# Patient Record
Sex: Female | Born: 2000 | Race: White | Hispanic: No | Marital: Single | Smoking: Never smoker
Health system: Southern US, Community
[De-identification: ages and names within clinical notes are randomized; demographics above are authoritative.]

## PROBLEM LIST (undated history)

## (undated) DIAGNOSIS — F419 Anxiety disorder, unspecified: Secondary | ICD-10-CM

## (undated) DIAGNOSIS — F32A Depression, unspecified: Secondary | ICD-10-CM

## (undated) DIAGNOSIS — J3089 Other allergic rhinitis: Secondary | ICD-10-CM

## (undated) DIAGNOSIS — F329 Major depressive disorder, single episode, unspecified: Secondary | ICD-10-CM

## (undated) DIAGNOSIS — T7840XA Allergy, unspecified, initial encounter: Secondary | ICD-10-CM

## (undated) HISTORY — PX: INDUCED ABORTION: SHX677

## (undated) HISTORY — PX: WISDOM TOOTH EXTRACTION: SHX21

## (undated) HISTORY — DX: Anxiety disorder, unspecified: F41.9

## (undated) HISTORY — DX: Allergy, unspecified, initial encounter: T78.40XA

## (undated) HISTORY — PX: TONSILLECTOMY AND ADENOIDECTOMY: SHX28

---
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
## 2020-01-02 ENCOUNTER — Encounter: Payer: Self-pay | Admitting: Physician Assistant

## 2020-01-02 ENCOUNTER — Telehealth (INDEPENDENT_AMBULATORY_CARE_PROVIDER_SITE_OTHER): Payer: Medicaid Other | Admitting: Physician Assistant

## 2020-01-02 DIAGNOSIS — B379 Candidiasis, unspecified: Secondary | ICD-10-CM

## 2020-01-02 DIAGNOSIS — T3695XA Adverse effect of unspecified systemic antibiotic, initial encounter: Secondary | ICD-10-CM

## 2020-01-02 DIAGNOSIS — Z01419 Encounter for gynecological examination (general) (routine) without abnormal findings: Secondary | ICD-10-CM

## 2020-01-02 DIAGNOSIS — J0140 Acute pansinusitis, unspecified: Secondary | ICD-10-CM | POA: Diagnosis not present

## 2020-01-02 MED ORDER — AMOXICILLIN-POT CLAVULANATE 875-125 MG PO TABS: 1.0000 | ORAL_TABLET | Freq: Two times a day (BID) | ORAL | 0 refills | Status: DC

## 2020-01-02 MED ORDER — FLUCONAZOLE 150 MG PO TABS: 150.0000 mg | ORAL_TABLET | Freq: Once | ORAL | 0 refills | Status: AC

## 2020-01-02 NOTE — Progress Notes (Signed)
Virtual telephone visit    Virtual Visit via Telephone Note   This visit type was conducted due to national recommendations for restrictions regarding the COVID-19 Pandemic (e.g. social distancing) in an effort to limit this patient's exposure and mitigate transmission in our community. Due to her co-morbid illnesses, this patient is at least at moderate risk for complications without adequate follow up. This format is felt to be most appropriate for this patient at this time. The patient did not have access to video technology or had technical difficulties with video requiring transitioning to audio format only (telephone). Physical exam was limited to content and character of the telephone converstion.    Patient location: Home Provider location: [facility]  I discussed the limitations of evaluation and management by telemedicine and the availability of in person appointments. The patient expressed understanding and agreed to proceed.   Visit Date: 01/02/2020  Today's healthcare provider: Margaretann Loveless, PA-C   Chief Complaint  Patient presents with  . Sinusitis   Subjective    Sinusitis This is a new problem. The current episode started 1 to 4 weeks ago. There has been no fever. Associated symptoms include ear pain (Last night), headaches, sinus pressure and swollen glands. Pertinent negatives include no chills, shortness of breath or sore throat. Treatments tried: took IBU last night becuase of muscle ache but much better and Allegra. The treatment provided mild relief.    Symptoms had started on 12/16/19 and she was covid tested and that was negative. Reports symptoms had been getting better then started getting worse again 3 days ago.   Patient Active Problem List   Diagnosis Date Noted  . Sleep difficulties 07/17/2015  . Inattention 06/17/2015  . 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 Headache disorder 06/11/2015  . Nocturnal enuresis 06/11/2015  . 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 Recurrent  sinus infections 06/11/2015  . Allergic rhinitis, seasonal 06/11/2015  . Migraine without aura and without status migrainosus, not intractable 01/05/2015  . Episodic tension-type headache, not intractable 01/05/2015   Past Medical History:  Diagnosis Date  . Depression   . Environmental and seasonal allergies       Medications: Outpatient Medications Prior to Visit  Medication Sig  . doxycycline (VIBRAMYCIN) 100 MG capsule Take 1 capsule (100 mg total) by mouth 2 (two) times daily. (Patient not taking: Reported on 12/08/2019)  . fluconazole (DIFLUCAN) 150 MG tablet 1 tablet if needed for yeast infection (Patient not taking: Reported on 12/08/2019)  . HYDROcodone-acetaminophen (NORCO/VICODIN) 5-325 MG tablet Take 1 tablet by mouth every 6 (six) hours as needed for moderate pain. (Patient not taking: Reported on 12/08/2019)  . naproxen (NAPROSYN) 500 MG tablet Take 1 tablet (500 mg total) by mouth 2 (two) times daily with a meal. (Patient not taking: Reported on 12/08/2019)  . norethindrone-ethinyl estradiol (LOESTRIN 1/20, 21,) 1-20 MG-MCG tablet Take 1 tablet by mouth daily. (Patient not taking: Reported on 12/08/2019)   No facility-administered medications prior to visit.    Review of Systems  Constitutional: Negative for appetite change, chills, fatigue and fever.  HENT: Positive for ear pain (Last night), postnasal drip, rhinorrhea, sinus pressure and sinus pain. Negative for sore throat.   Respiratory: Negative for chest tightness, shortness of breath and wheezing.   Cardiovascular: Negative for chest pain.  Gastrointestinal: Negative for abdominal pain.  Neurological: Positive for headaches. Negative for dizziness.    Last CBC Lab Results  Component Value Date   WBC 11.1 (H) 11/19/2019   HGB 12.2 11/19/2019  HCT 36.9 11/19/2019   MCV 87.6 11/19/2019   MCH 29.0 11/19/2019   RDW 12.1 11/19/2019   PLT 142 (L) 11/19/2019   Last metabolic panel Lab Results  Component Value  Date   GLUCOSE 129 (H) 11/19/2019   NA 137 11/19/2019   K 3.5 11/19/2019   CL 105 11/19/2019   CO2 24 11/19/2019   BUN 9 11/19/2019   CREATININE 0.71 11/19/2019   GFRNONAA >60 11/19/2019   GFRAA >60 11/19/2019   CALCIUM 9.3 11/19/2019   PROT 7.3 11/19/2019   ALBUMIN 4.4 11/19/2019   BILITOT 1.1 11/19/2019   ALKPHOS 56 11/19/2019   AST 27 11/19/2019   ALT 21 11/19/2019   ANIONGAP 8 11/19/2019      Objective    LMP 12/04/2019  BP Readings from Last 3 Encounters:  12/08/19 120/80  11/19/19 98/83  11/19/19 (!) 107/59   Wt Readings from Last 3 Encounters:  12/08/19 110 lb (49.9 kg) (17 %, Z= -0.95)*  11/19/19 108 lb 14.5 oz (49.4 kg) (15 %, Z= -1.02)*  11/19/19 109 lb (49.4 kg) (16 %, Z= -1.01)*   * Growth percentiles are based on CDC (Girls, 2-20 Years) data.        Assessment & Plan     1. Acute non-recurrent pansinusitis Worsening symptoms that have not responded to OTC medications. Will give augmentin as below. Continue allergy medications. Stay well hydrated and get plenty of rest. Call if no symptom improvement or if symptoms worsen. - amoxicillin-clavulanate (AUGMENTIN) 875-125 MG tablet; Take 1 tablet by mouth 2 (two) times daily.  Dispense: 20 tablet; Refill: 0  2. Antibiotic-induced yeast infection Gets yeast infections with antibiotics. Diflucan given as below.  - fluconazole (DIFLUCAN) 150 MG tablet; Take 1 tablet (150 mg total) by mouth once for 1 dose. May repeat in 48-72 hrs if needed  Dispense: 2 tablet; Refill: 0  3. Encntr for gyn exam (general) (routine) w/o abn findings Patient requesting new GYN referral for routine screenings. New referral placed.  - Ambulatory referral to Gynecology   No follow-ups on file.    I discussed the assessment and treatment plan with the patient. The patient was provided an opportunity to ask questions and all were answered. The patient agreed with the plan and demonstrated an understanding of the instructions.     The patient was advised to call back or seek an in-person evaluation if the symptoms worsen or if the condition fails to improve as anticipated.  I provided 14 minutes of non-face-to-face time during this encounter.  Delmer Islam, PA-C, have reviewed all documentation for this visit. The documentation on 01/06/20 for the exam, diagnosis, procedures, and orders are all accurate and complete.  Reine Just [facility] [phone] (phone) [phone] (fax)  [facility]

## 2020-01-02 NOTE — Patient Instructions (Signed)
Sinusitis, Adult Sinusitis is inflammation of your sinuses. Sinuses are hollow spaces in the bones around your face. Your sinuses are located:  Around your eyes.  In the middle of your forehead.  Behind your nose.  In your cheekbones. Mucus normally drains out of your sinuses. When your nasal tissues become inflamed or swollen, mucus can become trapped or blocked. This allows bacteria, viruses, and fungi to grow, which leads to infection. Most infections of the sinuses are caused by a virus. Sinusitis can develop quickly. It can last for up to 4 weeks (acute) or for more than 12 weeks (chronic). Sinusitis often develops after a cold. What are the causes? This condition is caused by anything that creates swelling in the sinuses or stops mucus from draining. This includes:  Allergies.  Asthma.  Infection from bacteria or viruses.  Deformities or blockages in your nose or sinuses.  Abnormal growths in the nose (nasal polyps).  Pollutants, such as chemicals or irritants in the air.  Infection from fungi (rare). What increases the risk? You are more likely to develop this condition if you:  Have a weak body defense system (immune system).  Do a lot of swimming or diving.  Overuse nasal sprays.  Smoke. What are the signs or symptoms? The main symptoms of this condition are pain and a feeling of pressure around the affected sinuses. Other symptoms include:  Stuffy nose or congestion.  Thick drainage from your nose.  Swelling and warmth over the affected sinuses.  Headache.  Upper toothache.  A cough that may get worse at night.  Extra mucus that collects in the throat or the back of the nose (postnasal drip).  Decreased sense of smell and taste.  Fatigue.  A fever.  Sore throat.  Bad breath. How is this diagnosed? This condition is diagnosed based on:  Your symptoms.  Your medical history.  A physical exam.  Tests to find out if your condition is  acute or chronic. This may include: ? Checking your nose for nasal polyps. ? Viewing your sinuses using a device that has a light (endoscope). ? Testing for allergies or bacteria. ? Imaging tests, such as an MRI or CT scan. In rare cases, a bone biopsy may be done to rule out more serious types of fungal sinus disease. How is this treated? Treatment for sinusitis depends on the cause and whether your condition is chronic or acute.  If caused by a virus, your symptoms should go away on their own within 10 days. You may be given medicines to relieve symptoms. They include: ? Medicines that shrink swollen nasal passages (topical intranasal decongestants). ? Medicines that treat allergies (antihistamines). ? A spray that eases inflammation of the nostrils (topical intranasal corticosteroids). ? Rinses that help get rid of thick mucus in your nose (nasal saline washes).  If caused by bacteria, your health care provider may recommend waiting to see if your symptoms improve. Most bacterial infections will get better without antibiotic medicine. You may be given antibiotics if you have: ? A severe infection. ? A weak immune system.  If caused by narrow nasal passages or nasal polyps, you may need to have surgery. Follow these instructions at home: Medicines  Take, use, or apply over-the-counter and prescription medicines only as told by your health care provider. These may include nasal sprays.  If you were prescribed an antibiotic medicine, take it as told by your health care provider. Do not stop taking the antibiotic even if you start   to feel better. Hydrate and humidify   Drink enough fluid to keep your urine pale yellow. Staying hydrated will help to thin your mucus.  Use a cool mist humidifier to keep the humidity level in your home above 50%.  Inhale steam for 10-15 minutes, 3-4 times a day, or as told by your health care provider. You can do this in the bathroom while a hot shower is  running.  Limit your exposure to cool or dry air. Rest  Rest as much as possible.  Sleep with your head raised (elevated).  Make sure you get enough sleep each night. General instructions   Apply a warm, moist washcloth to your face 3-4 times a day or as told by your health care provider. This will help with discomfort.  Wash your hands often with soap and water to reduce your exposure to germs. If soap and water are not available, use hand sanitizer.  Do not smoke. Avoid being around people who are smoking (secondhand smoke).  Keep all follow-up visits as told by your health care provider. This is important. Contact a health care provider if:  You have a fever.  Your symptoms get worse.  Your symptoms do not improve within 10 days. Get help right away if:  You have a severe headache.  You have persistent vomiting.  You have severe pain or swelling around your face or eyes.  You have vision problems.  You develop confusion.  Your neck is stiff.  You have trouble breathing. Summary  Sinusitis is soreness and inflammation of your sinuses. Sinuses are hollow spaces in the bones around your face.  This condition is caused by nasal tissues that become inflamed or swollen. The swelling traps or blocks the flow of mucus. This allows bacteria, viruses, and fungi to grow, which leads to infection.  If you were prescribed an antibiotic medicine, take it as told by your health care provider. Do not stop taking the antibiotic even if you start to feel better.  Keep all follow-up visits as told by your health care provider. This is important. This information is not intended to replace advice given to you by your health care provider. Make sure you discuss any questions you have with your health care provider. Document Revised: 08/27/2017 Document Reviewed: 08/27/2017 Elsevier Patient Education  2020 [employer] Inc.  

## 2020-01-05 ENCOUNTER — Telehealth: Payer: Self-pay

## 2020-01-05 NOTE — Telephone Encounter (Signed)
Copied from CRM [id number]. Topic: Referral - Request for Referral >> Jan 02, 2020 12:07 PM Dalphine Handing A wrote: Patient would like a new obgyn referral placed. Patient did not like the office she was initially referred to and would also like a female provider that is compatible with her insurance. Please advise

## 2020-01-05 NOTE — Telephone Encounter (Signed)
This referral had been placed.

## 2020-02-03 ENCOUNTER — Encounter: Payer: Medicaid Other | Admitting: Obstetrics and Gynecology

## 2020-02-26 ENCOUNTER — Other Ambulatory Visit: Payer: Self-pay

## 2020-02-26 ENCOUNTER — Ambulatory Visit (INDEPENDENT_AMBULATORY_CARE_PROVIDER_SITE_OTHER): Payer: Medicaid Other | Admitting: Obstetrics and Gynecology

## 2020-02-26 ENCOUNTER — Encounter: Payer: Self-pay | Admitting: Obstetrics and Gynecology

## 2020-02-26 VITALS — BP 100/70 | Ht 64.0 in | Wt 106.4 lb

## 2020-02-26 DIAGNOSIS — N760 Acute vaginitis: Secondary | ICD-10-CM | POA: Diagnosis not present

## 2020-02-26 DIAGNOSIS — N9489 Other specified conditions associated with female genital organs and menstrual cycle: Secondary | ICD-10-CM | POA: Diagnosis not present

## 2020-02-26 DIAGNOSIS — R102 Pelvic and perineal pain unspecified side: Secondary | ICD-10-CM

## 2020-02-26 NOTE — Progress Notes (Signed)
Pt states she here for a U/S. Pt says she  has a cyst on her ovaries and she has a yeast infection x 5 days.

## 2020-02-26 NOTE — Progress Notes (Signed)
Patient ID: Pamela Foster, female   DOB: 08-Nov-2000, 19 y.o.   MRN: [id number]  Reason for Consult: Gynecologic Exam   Referred by Suella Grove*  Subjective:     HPI:  Pamela Foster is a 19 y.o. female nonicteric patient presents today with complaints of pain and cramps during her period.  She reports that since her age of menarche she has always had 1 to 2 days of cramps lower abdomen and back pain during her menstrual cycle.  She reports that ibuprofen generally improves the symptoms.  She reports that over the last 4 to 5 months she has had new onset of lower pelvic pain.  She reports that the pain is generally on the left.  She describes it as a throbbing or very sharp sensation.  She reports that the pain comes and goes.  It usually presents for 10 to 20 seconds.  It can occur throughout the day and throughout the month.   She was seen in the ER on 8/11 and had a CT scan which was suggestive of left-sided renal insufficiency.  And suggestive of pelvic congestion syndrome.  She has not previously been evaluated for this.  She reports that she does not currently desire pregnancy.  She reports that she is sexually active.  She reports that she infrequently uses condoms as pregnancy prevention.  She has been on birth control methods in the past but she does not desire to be on birth control currently.  She reports that she used Nexplanon and had daily spotting.  She had the Nexplanon removed after 1 year.  She reports that she used the birth control patch in the past but did not like it.  She has been very concerned that she has an ovarian cyst.  She was supposed to have an ultrasound for this today although our ultrasound tech is out of the office because she is sick.  She is additionally concerned that she may have a yeast infection.  She reports that she had a extra fluconazole at home which she took a few days ago because of this yeast infection.  Gynecological History Menarche:  12 LMP: 02/18/2020 Describes periods as regular occurring monthly generally every 40 days. Last pap smear: Under 21 History of STDs: No Sexually Active: Yes.  She reports pain with intercourse.  She reports that the pain does not happen every time she has intercourse but when it does occur it is generally left-sided.  She reports that the pain feels deep and similar to the throbbing pain she experiences throughout the month.  She reports that this is also new since July or August.  Obstetrical History [id number]  Past Medical History:  Diagnosis Date  . Depression   . Environmental and seasonal allergies    Family History  Problem Relation Age of Onset  . Other Father 24       Drowned  . Heart attack Maternal Grandfather 67   Past Surgical History:  Procedure Laterality Date  . TONSILLECTOMY AND ADENOIDECTOMY      Short Social History:  Social History   Tobacco Use  . Smoking status: Passive Smoke Exposure - Never Smoker  . Smokeless tobacco: Never Used  Substance Use Topics  . Alcohol use: No    Alcohol/week: 0.0 standard drinks    No Known Allergies  No current outpatient medications on file.   No current facility-administered medications for this visit.    Review of Systems  Constitutional: Negative for  chills, fatigue, fever and unexpected weight change.  HENT: Negative for trouble swallowing.  Eyes: Negative for loss of vision.  Respiratory: Negative for cough, shortness of breath and wheezing.  Cardiovascular: Negative for chest pain, leg swelling, palpitations and syncope.  GI: Negative for abdominal pain, blood in stool, diarrhea, nausea and vomiting.  GU: Negative for difficulty urinating, dysuria, frequency and hematuria.  Musculoskeletal: Negative for back pain, leg pain and joint pain.  Skin: Negative for rash.  Neurological: Negative for dizziness, headaches, light-headedness, numbness and seizures.  Psychiatric: Negative for behavioral problem,  confusion, depressed mood and sleep disturbance.        Objective:  Objective   Vitals:   02/26/20 1126  BP: 100/70  Weight: 106 lb 6.4 oz (48.3 kg)  Height: 5\' 4"  (1.626 m)   Body mass index is 18.26 kg/m.  Physical Exam Vitals and nursing note reviewed.  Constitutional:      Appearance: She is well-developed.  HENT:     Head: Normocephalic and atraumatic.  Eyes:     Pupils: Pupils are equal, round, and reactive to light.  Cardiovascular:     Rate and Rhythm: Normal rate and regular rhythm.  Pulmonary:     Effort: Pulmonary effort is normal. No respiratory distress.  Genitourinary:    Comments: External: Vulva Normal. No lesions noted.  Speculum examination:  Cervix normal . No blood in the vaginal vault. Scant discharge.  Bimanual examination: Uterus midline, non-tender, normal in size, shape and contour.  No CMT. Adnexa tender bilaterally. Tender over left ovary similar to her reported episodes of pain.  Pelvis not fixed.  Skin:    General: Skin is warm and dry.  Neurological:     Mental Status: She is alert and oriented to person, place, and time.  Psychiatric:        Behavior: Behavior normal.        Thought Content: Thought content normal.        Judgment: Judgment normal.    Wet Prep: Clue Cells: Negative Fungal elements: Negative Trichomonas: Negative  Assessment/Plan:     19 year old with pelvic pain and pain with intercourse Previous CT from 11/19/2019 suggestive of pelvic congestion syndrome. Patient currently takes ibuprofen for pain during menstrual cycles. Patient is not interested in hormonal birth control options.   Will refer to interventional radiology for further evaluation and management of pelvic congestion syndrome.  Provide patient with information regarding pelvic congestion syndrome. Patient desires pelvic ultrasound to rule out possibility of ovarian cyst.  Will order pelvic ultrasound through the hospital and follow-up 3 weeks.  Acute  vaginitis-no signs of active infection on wet mount-we will send new swab to confirm.  Seems to have resolved.  More than 45 minutes were spent face to face with the patient in the room, reviewing the medical record, labs and images, and coordinating care for the patient. The plan of management was discussed in detail and counseling was provided.     01/19/2020 MD [facility], [facility] 02/26/2020 12:35 PM

## 2020-02-29 LAB — NUSWAB BV AND CANDIDA, NAA
Candida albicans, NAA: NEGATIVE
Candida glabrata, NAA: NEGATIVE

## 2020-03-01 ENCOUNTER — Other Ambulatory Visit: Payer: Self-pay

## 2020-03-01 ENCOUNTER — Ambulatory Visit
Admission: RE | Admit: 2020-03-01 | Discharge: 2020-03-01 | Disposition: A | Discharge status: Home / Self Care | Payer: Medicaid Other | Source: Ambulatory Visit | Attending: Obstetrics and Gynecology | Admitting: Obstetrics and Gynecology

## 2020-03-01 DIAGNOSIS — R102 Pelvic and perineal pain: Secondary | ICD-10-CM | POA: Insufficient documentation

## 2020-03-10 ENCOUNTER — Other Ambulatory Visit: Payer: Self-pay

## 2020-03-10 ENCOUNTER — Ambulatory Visit
Admission: EM | Admit: 2020-03-10 | Discharge: 2020-03-10 | Disposition: A | Discharge status: Home / Self Care | Payer: Medicaid Other | Attending: Physician Assistant | Admitting: Physician Assistant

## 2020-03-10 ENCOUNTER — Ambulatory Visit (INDEPENDENT_AMBULATORY_CARE_PROVIDER_SITE_OTHER): Payer: Medicaid Other

## 2020-03-10 DIAGNOSIS — M79641 Pain in right hand: Secondary | ICD-10-CM | POA: Diagnosis not present

## 2020-03-10 DIAGNOSIS — M778 Other enthesopathies, not elsewhere classified: Secondary | ICD-10-CM

## 2020-03-10 DIAGNOSIS — M25531 Pain in right wrist: Secondary | ICD-10-CM

## 2020-03-10 MED ORDER — NAPROXEN 500 MG PO TABS: 500.0000 mg | ORAL_TABLET | Freq: Two times a day (BID) | ORAL | 0 refills | Status: AC

## 2020-03-10 NOTE — Discharge Instructions (Signed)
X-rays are normal.  I suspect she likely has overuse tendinitis of the right wrist.  There may be some level of underlying carpal tunnel syndrome.  Wear the splint as provided.  Also ice the wrist every couple of hours to help with pain relief.  I have sent naproxen to the pharmacy to help with pain and inflammation.  You can also take Tylenol if needed.  Follow up with orthopedics if you are feeling better in the next couple weeks or if symptoms worsen.  You may have a condition requiring you to follow up with Orthopedics so please call one of the following office for appointment:   [facility] [address] [place], [place] [address] Phone: [phone]  [facility] [address], [place], [place] [address] Phone: [phone]

## 2020-03-10 NOTE — ED Provider Notes (Signed)
[facility]    CSN: [id number] Arrival date & time: 03/10/20  1342      History   Chief Complaint Chief Complaint  Patient presents with  . Hand Pain    right    HPI Pamela Foster is a 19 y.o. female presenting for complaints of right hand and right wrist pain off and on for a year.  Patient is right-handed.  She says she has never had this condition checked out.  She admits to 3-4 episodes of having right hand and wrist pain.  She states that she was lifting boxes today at work and is now having increased pain of her hand/wrist.  Patient denies any numbness or tingling currently.  She says that when she is had pain in her wrist and hand before she has had some numbness and tingling in her thumb and index finger.  Patient denies any weakness.  She does admit to some increased pain with flexion of the wrist as well as extension.  Admits to some increased pain with gripping.  Patient denies any known history of carpal tunnel or tendinitis.  She has not taken any over-the-counter medication for symptoms.  She has not used brace or iced the hand/wrist either.  She has no other concerns today.  HPI  Past Medical History:  Diagnosis Date  . Depression   . Environmental and seasonal allergies     Patient Active Problem List   Diagnosis Date Noted  . Sleep difficulties 07/17/2015  . Inattention 06/17/2015  . 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 Headache disorder 06/11/2015  . Nocturnal enuresis 06/11/2015  . 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 Recurrent sinus infections 06/11/2015  . Allergic rhinitis, seasonal 06/11/2015  . Migraine without aura and without status migrainosus, not intractable 01/05/2015  . Episodic tension-type headache, not intractable 01/05/2015    Past Surgical History:  Procedure Laterality Date  . TONSILLECTOMY AND ADENOIDECTOMY      OB History   No obstetric history on file.      Home Medications    Prior to Admission medications   Medication Sig Start Date  End Date Taking? Authorizing Provider  naproxen (NAPROSYN) 500 MG tablet Take 1 tablet (500 mg total) by mouth 2 (two) times daily for 15 days. 03/10/20 03/25/20  Shirlee Latch, PA-C    Family History Family History  Problem Relation Age of Onset  . Other Father 80       Drowned  . Heart attack Maternal Grandfather 20    Social History Social History   Tobacco Use  . Smoking status: Passive Smoke Exposure - Never Smoker  . Smokeless tobacco: Never Used  Vaping Use  . Vaping Use: Never used  Substance Use Topics  . Alcohol use: No    Alcohol/week: 0.0 standard drinks  . Drug use: No     Allergies   Patient has no known allergies.   Review of Systems Review of Systems  Musculoskeletal: Positive for arthralgias. Negative for joint swelling.  Skin: Negative for color change, rash and wound.  Neurological: Negative for weakness and numbness.     Physical Exam Triage Vital Signs ED Triage Vitals  Enc Vitals Group     BP 03/10/20 1421 108/60     Pulse Rate 03/10/20 1421 69     Resp 03/10/20 1421 16     Temp 03/10/20 1421 98.7 F (37.1 C)     Temp src --      SpO2 03/10/20 1421 100 %  Weight 03/10/20 1421 107 lb (48.5 kg)     Height 03/10/20 1421 5\' 4"  (1.626 m)     Head Circumference --      Peak Flow --      Pain Score 03/10/20 1420 8     Pain Loc --      Pain Edu? --      Excl. in GC? --    No data found.  Updated Vital Signs BP 108/60   Pulse 69   Temp 98.7 F (37.1 C)   Resp 16   Ht 5\' 4"  (1.626 m)   Wt 107 lb (48.5 kg)   LMP 02/18/2020   SpO2 100%   BMI 18.37 kg/m    Physical Exam Vitals and nursing note reviewed.  Constitutional:      General: She is not in acute distress.    Appearance: Normal appearance. She is not ill-appearing or toxic-appearing.  HENT:     Head: Normocephalic and atraumatic.  Eyes:     General: No scleral icterus.       Right eye: No discharge.        Left eye: No discharge.     Conjunctiva/sclera:  Conjunctivae normal.  Cardiovascular:     Rate and Rhythm: Normal rate and regular rhythm.     Pulses: Normal pulses.  Pulmonary:     Effort: Pulmonary effort is normal. No respiratory distress.  Musculoskeletal:     Left wrist: Tenderness present. No swelling. Crepitus: diffuse TTP of wrist, but has most pain with palpation at DRUJ. Normal range of motion.     Left hand: Tenderness (diffuse TTP dorsal metacarpals and base of thumb) present. No swelling. Normal range of motion. Normal sensation.     Cervical back: Neck supple.     Comments: Negative Tinel and Phalen   Skin:    General: Skin is dry.  Neurological:     General: No focal deficit present.     Mental Status: She is alert. Mental status is at baseline.     Motor: No weakness.     Gait: Gait normal.  Psychiatric:        Mood and Affect: Mood normal.        Behavior: Behavior normal.        Thought Content: Thought content normal.      UC Treatments / Results  Labs (all labs ordered are listed, but only abnormal results are displayed) Labs Reviewed - No data to display  EKG   Radiology DG Wrist Complete Right  Result Date: 03/10/2020 CLINICAL DATA:  Right wrist pain.  No reported injury. EXAM: RIGHT WRIST - COMPLETE 3+ VIEW COMPARISON:  None. FINDINGS: There is no evidence of fracture or dislocation. 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 There is no evidence of arthropathy or other focal bone abnormality. Soft tissues are unremarkable. IMPRESSION: No acute osseous abnormality in the right wrist. 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 Electronically Signed   By: 13/01/2020 M.D.   On: 03/10/2020 15:05   DG Hand Complete Right  Result Date: 03/10/2020 CLINICAL DATA:  Right hand pain.  No reported injury. EXAM: RIGHT HAND - COMPLETE 3+ VIEW COMPARISON:  None. FINDINGS: There is no evidence of fracture or dislocation. There is no evidence of arthropathy or other focal  bone abnormality. Soft tissues are unremarkable. IMPRESSION: No acute osseous abnormality in the right hand. Electronically Signed   By: 14/04/2019 M.D.   On: 03/10/2020 15:02    Procedures  Procedures (including critical care time)  Medications Ordered in UC Medications - No data to display  Initial Impression / Assessment and Plan / UC Course  I have reviewed the triage vital signs and the nursing notes.  Pertinent labs & imaging results that were available during my care of the patient were reviewed by me and considered in my medical decision making (see chart for details).   Imaging obtained today due to complaints of 1 year intermittent history of pain in the hand and wrist.  X-rays are within normal limits.  I suspect she has some tendinitis about the thumb and generalized hand pain due to overuse.  She may have some underlying carpal tunnel since she has had symptoms of numbness and tingling in her thumb and index finger in the past.  Advised her to start naproxen as prescribed at this time.  Patient provided with wrist brace.  Advised to ice the area as well and she may take Tylenol too.  Advise if she is not getting better to follow-up with orthopedics as she may need further testing and imaging.  Final Clinical Impressions(s) / UC Diagnoses   Final diagnoses:  Tendinitis of right wrist  Right hand pain  Right wrist pain     Discharge Instructions     X-rays are normal.  I suspect she likely has overuse tendinitis of the right wrist.  There may be some level of underlying carpal tunnel syndrome.  Wear the splint as provided.  Also ice the wrist every couple of hours to help with pain relief.  I have sent naproxen to the pharmacy to help with pain and inflammation.  You can also take Tylenol if needed.  Follow up with orthopedics if you are feeling better in the next couple weeks or if symptoms worsen.  You may have a condition requiring you to follow up with Orthopedics so  please call one of the following office for appointment:   [facility] [address] [place], [place] [address] Phone: [phone]  [facility] [address], [place], [place] [address] Phone: [phone]     ED Prescriptions    Medication Sig Dispense Auth. Provider   naproxen (NAPROSYN) 500 MG tablet Take 1 tablet (500 mg total) by mouth 2 (two) times daily for 15 days. 30 tablet Gareth Morgan     PDMP not reviewed this encounter.   Shirlee Latch, PA-C 03/11/20 [phone]

## 2020-03-10 NOTE — ED Triage Notes (Signed)
Pt reports having R hand and wrist pain "for about a year". sts today she was lifting boxes at work and now her hand/wrist is hurting more. No known injury to hand.

## 2020-03-22 DIAGNOSIS — R102 Pelvic and perineal pain: Secondary | ICD-10-CM | POA: Insufficient documentation

## 2020-03-26 ENCOUNTER — Ambulatory Visit: Payer: Medicaid Other | Admitting: Obstetrics and Gynecology

## 2020-04-06 ENCOUNTER — Ambulatory Visit: Payer: Medicaid Other | Admitting: Obstetrics and Gynecology

## 2020-04-14 ENCOUNTER — Other Ambulatory Visit: Payer: Self-pay

## 2020-04-14 ENCOUNTER — Ambulatory Visit (LOCAL_COMMUNITY_HEALTH_CENTER): Payer: Medicaid Other

## 2020-04-14 VITALS — Ht 64.0 in | Wt 106.0 lb

## 2020-04-14 DIAGNOSIS — Z3201 Encounter for pregnancy test, result positive: Secondary | ICD-10-CM | POA: Diagnosis not present

## 2020-04-14 LAB — PREGNANCY, URINE: Preg Test, Ur: POSITIVE — AB

## 2020-04-14 MED ORDER — PRENATAL 27-0.8 MG PO TABS: 1.0000 | ORAL_TABLET | Freq: Every day | ORAL | 0 refills | Status: DC

## 2020-04-14 NOTE — Progress Notes (Signed)
UPT positive. Per pt,  Plans care at [facility] if she can get medicaid status needed for prenatal care. Local prenatal resource list provided.  States unplanned preg and has family support. Per pt request, Hazeline Junker, LCSW contact card given along with [employer]  card. Pt denies depression/sadness currently. To DSS for Medicaid/Preg women. Jerel Shepherd, RN

## 2020-05-18 ENCOUNTER — Encounter: Payer: Self-pay | Admitting: Obstetrics and Gynecology

## 2020-05-18 ENCOUNTER — Ambulatory Visit (INDEPENDENT_AMBULATORY_CARE_PROVIDER_SITE_OTHER): Payer: Medicaid Other | Admitting: Obstetrics and Gynecology

## 2020-05-18 ENCOUNTER — Other Ambulatory Visit: Payer: Self-pay

## 2020-05-18 VITALS — BP 100/68 | Ht 64.0 in | Wt 106.4 lb

## 2020-05-18 DIAGNOSIS — Z3A12 12 weeks gestation of pregnancy: Secondary | ICD-10-CM

## 2020-05-18 DIAGNOSIS — Z3401 Encounter for supervision of normal first pregnancy, first trimester: Secondary | ICD-10-CM

## 2020-05-18 DIAGNOSIS — Z3491 Encounter for supervision of normal pregnancy, unspecified, first trimester: Secondary | ICD-10-CM

## 2020-05-18 LAB — POCT URINALYSIS DIPSTICK OB
Glucose, UA: NEGATIVE
POC,PROTEIN,UA: NEGATIVE

## 2020-05-18 NOTE — Patient Instructions (Signed)
Initial steps to help :   B6 (pyridoxine) 25 mg,  3-4 times a day- 200 mg a day total Unisom (doxylamine) 25 mg at bedtime **B6 and Unisom are available as a combination prescription medications called diclegis and bonjesta  B1 (thiamin)  50-100 mg 1-2 a day-  100 mg a day total  Continue prenatal vitamin with iron and thiamin. If it is not tolerated switch to 1 mg of folic acid.  Can add medication for gastric reflux if needed.  Subsequent steps to be added to B1, B6, and Unisom:  1. Antihistamine (one of the following medications) Dramamine      25-50 mg every 4-6 hours Benadryl      25-50 mg every 4-6 hours Meclizine      25 mg every 6 hours  2. Dopamine Antagonist (one of the following medications) Metoclopramide  (Reglan)  5-10 mg every 6-8 hours         PO Promethazine   (Phenergan)   12.5-25 mg every 4-6 hours      PO or rectal Prochlorperazine  (Compazine)  5-10 mg every 6-8 hours     25mg  BID rectally   Subsequent steps if there has still not been improvement in symptoms:  3. Daily stool softner:  Colace 100 mg twice a day  4. Ondansetron  (Zofran)   4-8 mg every 6-8 hours     Hyperemesis Gravidarum Hyperemesis gravidarum is a severe form of nausea and vomiting that happens during pregnancy. Hyperemesis is worse than morning sickness. It may cause you to have nausea or vomiting all day for many days. It may keep you from eating and drinking enough food and liquids, which can lead to dehydration, malnutrition, and weight loss. Hyperemesis usually occurs during the first half (the first 20 weeks) of pregnancy. It often goes away once a woman is in her second half of pregnancy. However, sometimes hyperemesis continues through an entire pregnancy. What are the causes? The cause of this condition is not known. It may be associated with:  Changes in hormones in the body during pregnancy.  Changes in the gastrointestinal system.  Genetic or inherited conditions. What are  the signs or symptoms? Symptoms of this condition include:  Severe nausea and vomiting that does not go away.  Problems keeping food down.  Weight loss.  Loss of body fluid (dehydration).  Loss of appetite. You may have no desire to eat or you may not like the food you have previously enjoyed. How is this diagnosed? This condition may be diagnosed based on your medical history, your symptoms, and a physical exam. You may also have other tests, including:  Blood tests.  Urine tests.  Blood pressure tests.  Ultrasound to look for problems with the placenta or to check if you are pregnant with more than one baby. How is this treated? This condition is managed by controlling symptoms. This may include:  Following an eating plan. This can help to lessen nausea and vomiting.  Treatments that do not use medicine. These include acupressure bracelets, hypnosis, and eating or drinking foods or fluids that contain ginger, ginger ale, or ginger tea.  Taking prescription medicine or over-the-counter medicine as told by your health care provider.  Continuing to take prenatal vitamins. You may need to change what kind you take and when you take them. Follow your health care provider's instructions about prenatal vitamins. An eating plan and medicines are often used together to help control symptoms. If medicines do not  help relieve nausea and vomiting, you may need to receive fluids through an IV at the hospital. Follow these instructions at home: To help relieve your symptoms, listen to your body. Everyone is different and has different preferences. Find what works best for you. Here are some things you can try to help relieve your symptoms: Meals and snacks  Eat 5-6 small meals daily instead of 3 large meals. Eating small meals and snacks can help you avoid an empty stomach.  Before getting out of bed, eat a couple of crackers to avoid moving around on an empty stomach.  Eat a  protein-rich snack before bed. Examples include cheese and crackers, or a peanut butter sandwich made with 1 slice of whole-wheat bread and 1 tsp (5 g) of peanut butter.  Eat and drink slowly.  Try eating starchy foods as these are usually tolerated well. Examples include cereal, toast, bread, potatoes, pasta, rice, and pretzels.  Eat at least one serving of protein with your meals and snacks. Protein options include lean meats, poultry, seafood, beans, nuts, nut butters, eggs, cheese, and yogurt.  Eat or suck on things that have ginger in them. It may help to relieve nausea. Add  tsp (0.44 g) ground ginger to hot tea, or choose ginger tea.   Fluids It is important to stay hydrated. Try to:  Drink small amounts of fluids often.  Drink fluids 30 minutes before or after a meal to help lessen the feeling of a full stomach.  Drink 100% fruit juice or an electrolyte drink. An electrolyte drink contains sodium, potassium, and chloride.  Drink fluids that are cold, clear, and carbonated or sour. These include lemonade, ginger ale, lemon-lime soda, ice water, and sparkling water. Things to avoid Avoid the following:  Eating foods that trigger your symptoms. These may include spicy foods, coffee, high-fat foods, very sweet foods, and acidic foods.  Drinking more than 1 cup of fluid at a time.  Skipping meals. Nausea can be more intense on an empty stomach. If you cannot tolerate food, do not force it. Try sucking on ice chips or other frozen items and make up for missed calories later.  Lying down within 2 hours after eating.  Being exposed to environmental triggers. These may include food smells, smoky rooms, closed spaces, rooms with strong smells, warm or humid places, overly loud and noisy rooms, and rooms with motion or flickering lights. Try eating meals in a well-ventilated area that is free of strong smells.  Making quick and sudden changes in your movement.  Taking iron pills and  multivitamins that contain iron. If you take prescription iron pills, do not stop taking them unless your health care provider approves.  Preparing food. The smell of food can spoil your appetite or trigger nausea. General instructions  Brush your teeth or use a mouth rinse after meals.  Take over-the-counter and prescription medicines only as told by your health care provider.  Follow instructions from your health care provider about eating or drinking restrictions.  Talk with your health care provider about starting a supplement of vitamin B6.  Continue to take your prenatal vitamins as told by your health care provider. If you are having trouble taking your prenatal vitamins, talk with your health care provider about other options.  Keep all follow-up visits. This is important. Follow-up visits include prenatal visits. Contact a health care provider if:  You have pain in your abdomen.  You have a severe headache.  You have vision problems.  You are losing weight.  You feel weak or dizzy.  You cannot eat or drink without vomiting, especially if this goes on for a full day. Get help right away if:  You cannot drink fluids without vomiting.  You vomit blood.  You have constant nausea and vomiting.  You are very weak.  You faint.  You have a fever and your symptoms suddenly get worse. Summary  Hyperemesis gravidarum is a severe form of nausea and vomiting that happens during pregnancy.  Making some changes to your eating habits may help relieve nausea and vomiting.  This condition may be managed with lifestyle changes and medicines as prescribed by your health care provider.  If medicines do not help relieve nausea and vomiting, you may need to receive fluids through an IV at the hospital. This information is not intended to replace advice given to you by your health care provider. Make sure you discuss any questions you have with your health care provider. Document  Revised: 10/20/2019 Document Reviewed: 10/20/2019 Elsevier Patient Education  2021 [employer].    Contraception Choices Contraception, also called birth control, refers to methods or devices that prevent pregnancy. Hormonal methods Contraceptive implant A contraceptive implant is a thin, plastic tube that contains a hormone that prevents pregnancy. It is different from an intrauterine device (IUD). It is inserted into the upper part of the arm by a health care provider. Implants can be effective for up to 3 years. Progestin-only injections Progestin-only injections are injections of progestin, a synthetic form of the hormone progesterone. They are given every 3 months by a health care provider. Birth control pills Birth control pills are pills that contain hormones that prevent pregnancy. They must be taken once a day, preferably at the same time each day. A prescription is needed to use this method of contraception. Birth control patch The birth control patch contains hormones that prevent pregnancy. It is placed on the skin and must be changed once a week for three weeks and removed on the fourth week. A prescription is needed to use this method of contraception. Vaginal ring A vaginal ring contains hormones that prevent pregnancy. It is placed in the vagina for three weeks and removed on the fourth week. After that, the process is repeated with a new ring. A prescription is needed to use this method of contraception. Emergency contraceptive Emergency contraceptives prevent pregnancy after unprotected sex. They come in pill form and can be taken up to 5 days after sex. They work best the sooner they are taken after having sex. Most emergency contraceptives are available without a prescription. This method should not be used as your only form of birth control.   Barrier methods Female condom A female condom is a thin sheath that is worn over the penis during sex. Condoms keep sperm from going  inside a woman's body. They can be used with a sperm-killing substance (spermicide) to increase their effectiveness. They should be thrown away after one use. Female condom A female condom is a soft, loose-fitting sheath that is put into the vagina before sex. The condom keeps sperm from going inside a woman's body. They should be thrown away after one use. Diaphragm A diaphragm is a soft, dome-shaped barrier. It is inserted into the vagina before sex, along with a spermicide. The diaphragm blocks sperm from entering the uterus, and the spermicide kills sperm. A diaphragm should be left in the vagina for 6-8 hours after sex and removed within 24 hours. A  diaphragm is prescribed and fitted by a health care provider. A diaphragm should be replaced every 1-2 years, after giving birth, after gaining more than 15 lb (6.8 kg), and after pelvic surgery. Cervical cap A cervical cap is a round, soft latex or plastic cup that fits over the cervix. It is inserted into the vagina before sex, along with spermicide. It blocks sperm from entering the uterus. The cap should be left in place for 6-8 hours after sex and removed within 48 hours. A cervical cap must be prescribed and fitted by a health care provider. It should be replaced every 2 years. Sponge A sponge is a soft, circular piece of polyurethane foam with spermicide in it. The sponge helps block sperm from entering the uterus, and the spermicide kills sperm. To use it, you make it wet and then insert it into the vagina. It should be inserted before sex, left in for at least 6 hours after sex, and removed and thrown away within 30 hours. Spermicides Spermicides are chemicals that kill or block sperm from entering the cervix and uterus. They can come as a cream, jelly, suppository, foam, or tablet. A spermicide should be inserted into the vagina with an applicator at least 10-15 minutes before sex to allow time for it to work. The process must be repeated every  time you have sex. Spermicides do not require a prescription.   Intrauterine contraception Intrauterine device (IUD) An IUD is a T-shaped device that is put in a woman's uterus. There are two types:  Hormone IUD.This type contains progestin, a synthetic form of the hormone progesterone. This type can stay in place for 3-5 years.  Copper IUD.This type is wrapped in copper wire. It can stay in place for 10 years. Permanent methods of contraception Female tubal ligation In this method, a woman's fallopian tubes are sealed, tied, or blocked during surgery to prevent eggs from traveling to the uterus. Hysteroscopic sterilization In this method, a small, flexible insert is placed into each fallopian tube. The inserts cause scar tissue to form in the fallopian tubes and block them, so sperm cannot reach an egg. The procedure takes about 3 months to be effective. Another form of birth control must be used during those 3 months. Female sterilization This is a procedure to tie off the tubes that carry sperm (vasectomy). After the procedure, the man can still ejaculate fluid (semen). Another form of birth control must be used for 3 months after the procedure. Natural planning methods Natural family planning In this method, a couple does not have sex on days when the woman could become pregnant. Calendar method In this method, the woman keeps track of the length of each menstrual cycle, identifies the days when pregnancy can happen, and does not have sex on those days. Ovulation method In this method, a couple avoids sex during ovulation. Symptothermal method This method involves not having sex during ovulation. The woman typically checks for ovulation by watching changes in her temperature and in the consistency of cervical mucus. Post-ovulation method In this method, a couple waits to have sex until after ovulation. Where to find more information  Centers for Disease Control and Prevention:  [employer] Summary  Contraception, also called birth control, refers to methods or devices that prevent pregnancy.  Hormonal methods of contraception include implants, injections, pills, patches, vaginal rings, and emergency contraceptives.  Barrier methods of contraception can include female condoms, female condoms, diaphragms, cervical caps, sponges, and spermicides.  There are two types  of IUDs (intrauterine devices). An IUD can be put in a woman's uterus to prevent pregnancy for 3-5 years.  Permanent sterilization can be done through a procedure for males and females. Natural family planning methods involve nothaving sex on days when the woman could become pregnant. This information is not intended to replace advice given to you by your health care provider. Make sure you discuss any questions you have with your health care provider. Document Revised: 09/01/2019 Document Reviewed: 09/01/2019 Elsevier Patient Education  2021 [employer].

## 2020-05-18 NOTE — Progress Notes (Signed)
05/18/2020   Chief Complaint: Missed period  Transfer of Care Patient: no  History of Present Illness: [name] is a 20 y.o. G1P0000 [redacted]w[redacted]d based on Patient's last menstrual period was 02/18/2020 (exact date). with an Estimated Date of Delivery: 11/24/20, with the above CC.   Her periods were: regular periods every month She was using no method when she conceived.  She has Positive signs or symptoms of nausea/vomiting of pregnancy. She has Negative signs or symptoms of miscarriage or preterm labor She was not taking different medications around the time she conceived/early pregnancy. Since her LMP, she has not used alcohol Since her LMP, she has not used tobacco products Since her LMP, she has not used illegal drugs.    Current or past history of domestic violence. no  Infection History:  1. Since her LMP, she has not had a viral illness.  2. She denies close contact with children on a regular basis     3. She denies a history of chicken pox. She denies vaccination for chicken pox in the past. 4. Patient or partner has history of genital herpes  no 5. History of STI (GC, CT, HPV, syphilis, HIV)  no   6.  She does not live with someone with TB or TB exposed. 7. History of recent travel :  no 8. She identifies Negative Zika risk factors for her and her partner 65. There are not cats in the home in the home.  She understands that while pregnant she should not change cat litter.   Genetic Screening Questions: (Includes patient, baby's father, or anyone in either family)   1. Patient's age >/= 4 at [facility]  no 2. Thalassemia ([place], [place], Mediterranean, or Asian background): MCV<80  no 3. Neural tube defect (meningomyelocele, spina bifida, anencephaly)  no 4. Congenital heart defect  no  5. Down syndrome  no 6. Tay-Sachs (Jewish, [place]  no 7. Canavan's Disease  no 8. Sickle cell disease or trait (African)  no  9. Hemophilia or other blood disorders  no  10. Muscular  dystrophy  no  11. Cystic fibrosis  no  12. Huntington's Chorea  no  13. Mental retardation/autism  no 14. Other inherited genetic or chromosomal disorder  no 15. Maternal metabolic disorder (DM, PKU, etc)  no 16. Patient or FOB with a child with a birth defect not listed above no  16a. Patient or FOB with a birth defect themselves no 17. Recurrent pregnancy loss, or stillbirth  no  18. Any medications since LMP other than prenatal vitamins (include vitamins, supplements, OTC meds, drugs, alcohol)  no 19. Any other genetic/environmental exposure to discuss  no  ROS:  Review of Systems  Constitutional: Negative for chills, fever, malaise/fatigue and weight loss.  HENT: Negative for congestion, hearing loss and sinus pain.   Eyes: Negative for blurred vision and double vision.  Respiratory: Negative for cough, sputum production, shortness of breath and wheezing.   Cardiovascular: Negative for chest pain, palpitations, orthopnea and leg swelling.  Gastrointestinal: Negative for abdominal pain, constipation, diarrhea, nausea and vomiting.  Genitourinary: Negative for dysuria, flank pain, frequency, hematuria and urgency.  Musculoskeletal: Negative for back pain, falls and joint pain.  Skin: Negative for itching and rash.  Neurological: Negative for dizziness and headaches.  Psychiatric/Behavioral: Negative for depression, substance abuse and suicidal ideas. The patient is not nervous/anxious.     OBGYN History: As per HPI. OB History  Gravida Para Term Preterm AB Living  1 0 0 0  0 0  SAB IAB Ectopic Multiple Live Births  0 0 0 0 0    # Outcome Date GA Lbr Len/2nd Weight Sex Delivery Anes PTL Lv  1 Current             Any issues with any prior pregnancies: not applicable Any prior children are healthy, doing well, without any problems or issues: not applicable Last pap smear under 21 History of STIs: No   Past Medical History: Past Medical History:  Diagnosis Date  .  Depression   . Environmental and seasonal allergies     Past Surgical History: Past Surgical History:  Procedure Laterality Date  . TONSILLECTOMY AND ADENOIDECTOMY    . WISDOM TOOTH EXTRACTION      Family History:  Family History  Problem Relation Age of Onset  . Other Father 76       Drowned  . Heart attack Maternal Grandfather 44   She denies any female cancers, bleeding or blood clotting disorders.   Social History:  Social History   Socioeconomic History  . Marital status: Single    Spouse name: Not on file  . Number of children: Not on file  . Years of education: Not on file  . Highest education level: Not on file  Occupational History  . Not on file  Tobacco Use  . Smoking status: Passive Smoke Exposure - Never Smoker  . Smokeless tobacco: Never Used  Vaping Use  . Vaping Use: Never used  Substance and Sexual Activity  . Alcohol use: Not Currently    Alcohol/week: 0.0 standard drinks    Comment: last use- "few years ago"  . Drug use: Not Currently    Types: Marijuana    Comment: last mj use - 4 mo ago  . Sexual activity: Yes    Birth control/protection: None  Other Topics Concern  . 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   Pamela Foster lives with her mother, grandmother, cousin, and cousins child.   Pamela Foster enjoys reading, listening to music and being active outside, especially climbing trees.   Pamela Foster does well in school.   Social Determinants of Health   Financial Resource Strain: Not on file  Food Insecurity: Not on file  Transportation Needs: Not on file  Physical Activity: Not on file  Stress: Not on file  Social Connections: Not on file  Intimate Partner Violence: Not At Risk  . Fear of Current or Ex-Partner: No  . Emotionally Abused: No  . Physically Abused: No  . Sexually Abused: No    Allergy: No Known Allergies  Current Outpatient Medications:  Current Outpatient Medications:  .   Prenatal Vit-Fe Fumarate-FA (MULTIVITAMIN-PRENATAL) 27-0.8 MG TABS tablet, Take 1 tablet by mouth daily at 12 noon., Disp: 100 tablet, Rfl: 0   Physical Exam: Physical Exam Vitals and nursing note reviewed.  HENT:     Head: Normocephalic and atraumatic.  Eyes:     Pupils: Pupils are equal, round, and reactive to light.  Neck:     Thyroid: No thyromegaly.  Cardiovascular:     Rate and Rhythm: Normal rate and regular rhythm.  Pulmonary:     Effort: Pulmonary effort is normal.  Abdominal:     General: Bowel sounds are normal. There is no distension.     Palpations: Abdomen is soft.     Tenderness: There is no abdominal tenderness. There is no guarding or rebound.  Musculoskeletal:  General: Normal range of motion.     Cervical back: Normal range of motion and neck supple.  Skin:    General: Skin is warm and dry.  Neurological:     Mental Status: She is alert and oriented to person, place, and time.  Psychiatric:        Mood and Affect: Affect normal.        Judgment: Judgment normal.      Assessment: [name] is a 20 y.o. G1P0000 [redacted]w[redacted]d based on Patient's last menstrual period was 02/18/2020 (exact date). with an Estimated Date of Delivery: 11/24/20,  for prenatal care.  Plan:  1) Avoid alcoholic beverages. 2) Patient encouraged not to smoke.  3) Discontinue the use of all non-medicinal drugs and chemicals.  4) Take prenatal vitamins daily.  5) Seatbelt use advised 6) Nutrition, food safety (fish, cheese advisories, and high nitrite foods) and exercise discussed. 7) Hospital and practice style delivering at [facility] discussed  8) Patient is asked about travel to areas at risk for the Zika virus, and counseled to avoid travel and exposure to mosquitoes or sexual partners who may have themselves been exposed to the virus. Testing is discussed, and will be ordered as appropriate.  9) Childbirth classes at [facility] advised 10) Genetic Screening, such as with 1st Trimester  Screening, cell free fetal DNA, AFP testing, and Ultrasound, as well as with amniocentesis and CVS as appropriate, is discussed with patient. She plans to decline genetic testing this pregnancy.  Patient reported in the middle of the visit that she would like to have a termination but did not know how to schedule one.  I told here that they could be scheduled through planned parent hood in [place] or [place]. Patient was able to look up their info on her phone to schedule.  We performed a bedside abdominal US that showed a CRL consistent with 12 weeks 0 days.  I gave her information and handouts regarding contraception choices.  She declined flu vaccination today.  Follow up as needed.  Problem list reviewed and updated.  I discussed the assessment and treatment plan with the patient. The patient was provided an opportunity to ask questions and all were answered. The patient agreed with the plan and demonstrated an understanding of the instructions.  Adelene Idler MD [facility], [facility] 05/18/2020 9:24 AM

## 2020-06-08 ENCOUNTER — Encounter: Payer: Self-pay | Admitting: Obstetrics and Gynecology

## 2020-06-14 ENCOUNTER — Encounter: Payer: Self-pay | Admitting: Obstetrics and Gynecology

## 2020-06-14 NOTE — Progress Notes (Signed)
Appointment cancelled

## 2020-06-16 DIAGNOSIS — F4320 Adjustment disorder, unspecified: Secondary | ICD-10-CM | POA: Diagnosis not present

## 2020-06-23 DIAGNOSIS — F4320 Adjustment disorder, unspecified: Secondary | ICD-10-CM | POA: Diagnosis not present

## 2020-06-30 DIAGNOSIS — F4320 Adjustment disorder, unspecified: Secondary | ICD-10-CM | POA: Diagnosis not present

## 2020-07-05 ENCOUNTER — Ambulatory Visit
Admission: EM | Admit: 2020-07-05 | Discharge: 2020-07-05 | Disposition: A | Discharge status: Home / Self Care | Payer: Medicaid Other

## 2020-07-05 ENCOUNTER — Other Ambulatory Visit: Payer: Self-pay

## 2020-07-05 ENCOUNTER — Encounter: Payer: Self-pay | Admitting: Emergency Medicine

## 2020-07-05 DIAGNOSIS — H9201 Otalgia, right ear: Secondary | ICD-10-CM

## 2020-07-05 DIAGNOSIS — R6884 Jaw pain: Secondary | ICD-10-CM

## 2020-07-05 NOTE — ED Triage Notes (Signed)
Patient in today c/o right ear pain x 1 day. Patient denies fever. Patient has not taken any OTC medications to help with her symptoms.

## 2020-07-05 NOTE — Discharge Instructions (Signed)
Since you dont want to take synthetic medications and you prefer more natural ones, you may try Turmetic seasoning in your food or get the capsules. Also you may get Arnica cream and massage on the jaw corner where you are tender. Avoid sweets since it causes more inflammation.  Stay on soft foods for one week

## 2020-07-05 NOTE — ED Provider Notes (Signed)
[facility]    CSN: [id number] Arrival date & time: 07/05/20  1120      History   Chief Complaint Chief Complaint  Patient presents with  . Otalgia    right    HPI Pamela Foster is a 20 y.o. female who presents with onset of R ear pain since yesterday. Denies fever and has not taken any OTC medications. She does not want to take synthetic medications. Denies URI symptoms or having dental problems. She has not been stressed and denies having issues with grinding her teeth. While in the exam room noticed when she burped felt a pain in her R ear.     Past Medical History:  Diagnosis Date  . Depression   . Environmental and seasonal allergies     Patient Active Problem List   Diagnosis Date Noted  . Encounter for supervision of normal first pregnancy in first trimester 05/18/2020  . Sleep difficulties 07/17/2015  . Inattention 06/17/2015  . 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 Headache disorder 06/11/2015  . Nocturnal enuresis 06/11/2015  . 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 Recurrent sinus infections 06/11/2015  . Allergic rhinitis, seasonal 06/11/2015  . Migraine without aura and without status migrainosus, not intractable 01/05/2015  . Episodic tension-type headache, not intractable 01/05/2015    Past Surgical History:  Procedure Laterality Date  . INDUCED ABORTION    . TONSILLECTOMY AND ADENOIDECTOMY    . WISDOM TOOTH EXTRACTION      OB History    Gravida  1   Para  0   Term  0   Preterm  0   AB  0   Living        SAB  0   IAB  0   Ectopic  0   Multiple      Live Births               Home Medications    Prior to Admission medications   Medication Sig Start Date End Date Taking? Authorizing Provider  Prenatal Vit-Fe Fumarate-FA (MULTIVITAMIN-PRENATAL) 27-0.8 MG TABS tablet Take 1 tablet by mouth daily at 12 noon. 04/14/20 07/23/20 Yes Federico Flake, MD    Family History Family History  Problem Relation Age of Onset  . Anxiety disorder  Mother   . Diabetes Mother   . Hypertension Mother   . Other Father 3       Drowned  . Heart attack Maternal Grandfather 89    Social History Social History   Tobacco Use  . Smoking status: Never Smoker  . Smokeless tobacco: Never Used  Vaping Use  . Vaping Use: Never used  Substance Use Topics  . Alcohol use: Not Currently    Alcohol/week: 0.0 standard drinks    Comment: last use- "few years ago"  . Drug use: Not Currently    Types: Marijuana    Comment: last mj use - 05/24/20     Allergies   Patient has no known allergies.   Review of Systems Review of Systems + R otalgia, rest is neg.  Physical Exam Triage Vital Signs ED Triage Vitals  Enc Vitals Group     BP 07/05/20 1143 99/67     Pulse Rate 07/05/20 1143 67     Resp 07/05/20 1143 18     Temp 07/05/20 1143 99.1 F (37.3 C)     Temp Source 07/05/20 1143 Oral     SpO2 07/05/20 1143 99 %     Weight 07/05/20 1143 110 lb (  49.9 kg)     Height 07/05/20 1143 5\' 4"  (1.626 m)     Head Circumference --      Peak Flow --      Pain Score 07/05/20 1142 6     Pain Loc --      Pain Edu? --      Excl. in GC? --    No data found.  Updated Vital Signs BP 99/67 (BP Location: Left Arm)   Pulse 67   Temp 99.1 F (37.3 C) (Oral)   Resp 18   Ht 5\' 4"  (1.626 m)   Wt 110 lb (49.9 kg)   LMP 07/05/2020 (Exact Date)   SpO2 99%   Breastfeeding Unknown   BMI 18.88 kg/m   Visual Acuity Right Eye Distance:   Left Eye Distance:   Bilateral Distance:    Right Eye Near:   Left Eye Near:    Bilateral Near:     Physical Exam Constitutional:      General: She is not in acute distress.    Appearance: Normal appearance. She is not toxic-appearing.  HENT:     Head: Atraumatic.     Right Ear: Tympanic membrane, ear canal and external ear normal.     Left Ear: Tympanic membrane, ear canal and external ear normal.     Nose: Nose normal.     Mouth/Throat:     Mouth: Mucous membranes are moist.     Pharynx: Oropharynx  is clear.     Comments: Teeth are healthy. Has very tender area behind the R jaw angle Eyes:     General: No scleral icterus.    Extraocular Movements: Extraocular movements intact.     Conjunctiva/sclera: Conjunctivae normal.  Pulmonary:     Effort: Pulmonary effort is normal.  Musculoskeletal:        General: Normal range of motion.     Cervical back: Neck supple.  Lymphadenopathy:     Cervical: No cervical adenopathy.  Skin:    General: Skin is warm and dry.     Findings: No rash.  Neurological:     Mental Status: She is alert and oriented to person, place, and time.     Gait: Gait normal.  Psychiatric:        Mood and Affect: Mood normal.        Behavior: Behavior normal.        Thought Content: Thought content normal.        Judgment: Judgment normal.    UC Treatments / Results  Labs (all labs ordered are listed, but only abnormal results are displayed) Labs Reviewed - No data to display  EKG   Radiology No results found.  Procedures Procedures (including critical care time)  Medications Ordered in UC Medications - No data to display  Initial Impression / Assessment and Plan / UC Course  I have reviewed the triage vital signs and the nursing notes. She may have eustachian tube dysfunction, and referred pain from her jaw. She was advised to stay on soft foods for a week and try some natural anti-inflammatory things since she does not want to take synthetic meds. See instructions.   Final Clinical Impressions(s) / UC Diagnoses   Final diagnoses:  Right ear pain  Pain in lower jaw     Discharge Instructions     Since you dont want to take synthetic medications and you prefer more natural ones, you may try Turmetic seasoning in your food or get the capsules. Also  you may get Arnica cream and massage on the jaw corner where you are tender. Avoid sweets since it causes more inflammation.  Stay on soft foods for one week     ED Prescriptions    None      PDMP not reviewed this encounter.   Garey Ham, PA-C 07/05/20 2252

## 2020-07-07 DIAGNOSIS — F4320 Adjustment disorder, unspecified: Secondary | ICD-10-CM | POA: Diagnosis not present

## 2020-07-12 ENCOUNTER — Other Ambulatory Visit: Payer: Self-pay

## 2020-07-12 ENCOUNTER — Ambulatory Visit
Admission: EM | Admit: 2020-07-12 | Discharge: 2020-07-12 | Disposition: A | Discharge status: Home / Self Care | Payer: Medicaid Other | Attending: Emergency Medicine | Admitting: Emergency Medicine

## 2020-07-12 DIAGNOSIS — R519 Headache, unspecified: Secondary | ICD-10-CM

## 2020-07-12 DIAGNOSIS — H9201 Otalgia, right ear: Secondary | ICD-10-CM | POA: Diagnosis not present

## 2020-07-12 DIAGNOSIS — B001 Herpesviral vesicular dermatitis: Secondary | ICD-10-CM | POA: Diagnosis not present

## 2020-07-12 MED ORDER — PREDNISONE 10 MG (21) PO TBPK
ORAL_TABLET | ORAL | 0 refills | Status: DC
Start: 2020-07-12 — End: 2020-10-25

## 2020-07-12 MED ORDER — TRAMADOL HCL 50 MG PO TABS: 100.0000 mg | ORAL_TABLET | Freq: Four times a day (QID) | ORAL | 0 refills | Status: DC | PRN

## 2020-07-12 MED ORDER — VALACYCLOVIR HCL 1 G PO TABS: 2000.0000 mg | ORAL_TABLET | Freq: Two times a day (BID) | ORAL | 0 refills | Status: AC

## 2020-07-12 NOTE — ED Provider Notes (Signed)
[facility]    CSN: [id number] Arrival date & time: 07/12/20  1358      History   Chief Complaint Chief Complaint  Patient presents with  . Ear Pain    HPI Pamela Foster is a 20 y.o. female.   HPI   20 year old female here for evaluation of right ear pain.  Patient reports that she has had pain in her right ear for the last 8 days.  Patient was evaluated in this urgent care 7 days ago and there is no infection found at that time.  Patient reports that she now has pain to the entire right side of her face, under the right side of her tongue, down the right side of her neck, and she has developed a cold sore on the right side of her lower lip.  Patient denies fever, discharge from her ear, runny nose, nasal congestion, or cough.    Past Medical History:  Diagnosis Date  . Depression   . Environmental and seasonal allergies     Patient Active Problem List   Diagnosis Date Noted  . Encounter for supervision of normal first pregnancy in first trimester 05/18/2020  . Sleep difficulties 07/17/2015  . Inattention 06/17/2015  . 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 Headache disorder 06/11/2015  . Nocturnal enuresis 06/11/2015  . 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 Recurrent sinus infections 06/11/2015  . Allergic rhinitis, seasonal 06/11/2015  . Migraine without aura and without status migrainosus, not intractable 01/05/2015  . Episodic tension-type headache, not intractable 01/05/2015    Past Surgical History:  Procedure Laterality Date  . INDUCED ABORTION    . TONSILLECTOMY AND ADENOIDECTOMY    . WISDOM TOOTH EXTRACTION      OB History    Gravida  1   Para  0   Term  0   Preterm  0   AB  0   Living        SAB  0   IAB  0   Ectopic  0   Multiple      Live Births               Home Medications    Prior to Admission medications   Medication Sig Start Date End Date Taking? Authorizing Provider  predniSONE (STERAPRED UNI-PAK 21 TAB) 10 MG (21) TBPK tablet  Take 6 tablets on day 1, 5 tablets day 2, 4 tablets day 3, 3 tablets day 4, 2 tablets day 5, 1 tablet day 6 07/12/20  Yes Becky Augusta, NP  traMADol (ULTRAM) 50 MG tablet Take 2 tablets (100 mg total) by mouth every 6 (six) hours as needed. 07/12/20  Yes Becky Augusta, NP  valACYclovir (VALTREX) 1000 MG tablet Take 2 tablets (2,000 mg total) by mouth 2 (two) times daily for 1 day. 07/12/20 07/13/20 Yes Becky Augusta, NP    Family History Family History  Problem Relation Age of Onset  . Anxiety disorder Mother   . Diabetes Mother   . Hypertension Mother   . Other Father 11       Drowned  . Heart attack Maternal Grandfather 25    Social History Social History   Tobacco Use  . Smoking status: Never Smoker  . Smokeless tobacco: Never Used  Vaping Use  . Vaping Use: Never used  Substance Use Topics  . Alcohol use: Not Currently    Alcohol/week: 0.0 standard drinks    Comment: last use- "few years ago"  . Drug use: Not Currently  Types: Marijuana    Comment: last mj use - 05/24/20     Allergies   Patient has no known allergies.   Review of Systems Review of Systems  Constitutional: Negative for activity change, appetite change and fever.  HENT: Positive for ear pain and sore throat. Negative for ear discharge.   Skin: Positive for rash.  Neurological: Negative for facial asymmetry, numbness and headaches.  Hematological: Positive for adenopathy. Does not bruise/bleed easily.  Psychiatric/Behavioral: Negative.      Physical Exam Triage Vital Signs ED Triage Vitals  Enc Vitals Group     BP 07/12/20 1420 110/66     Pulse Rate 07/12/20 1420 62     Resp 07/12/20 1420 17     Temp 07/12/20 1420 98.7 F (37.1 C)     Temp Source 07/12/20 1420 Oral     SpO2 07/12/20 1420 100 %     Weight 07/12/20 1418 108 lb 14.5 oz (49.4 kg)     Height 07/12/20 1418 5\' 4"  (1.626 m)     Head Circumference --      Peak Flow --      Pain Score 07/12/20 1418 6     Pain Loc --      Pain Edu? --       Excl. in GC? --    No data found.  Updated Vital Signs BP 110/66 (BP Location: Right Arm)   Pulse 62   Temp 98.7 F (37.1 C) (Oral)   Resp 17   Ht 5\' 4"  (1.626 m)   Wt 108 lb 14.5 oz (49.4 kg)   LMP 07/05/2020 (Exact Date)   SpO2 100%   BMI 18.69 kg/m   Visual Acuity Right Eye Distance:   Left Eye Distance:   Bilateral Distance:    Right Eye Near:   Left Eye Near:    Bilateral Near:     Physical Exam Vitals and nursing note reviewed.  Constitutional:      General: She is not in acute distress.    Appearance: Normal appearance. She is not ill-appearing.  HENT:     Head: Normocephalic and atraumatic.     Right Ear: Tympanic membrane, ear canal and external ear normal. There is no impacted cerumen.     Left Ear: Tympanic membrane, ear canal and external ear normal. There is no impacted cerumen.     Nose: Nose normal. No congestion or rhinorrhea.     Mouth/Throat:     Mouth: Mucous membranes are moist.     Pharynx: Oropharynx is clear. No oropharyngeal exudate or posterior oropharyngeal erythema.  Cardiovascular:     Rate and Rhythm: Normal rate and regular rhythm.     Pulses: Normal pulses.     Heart sounds: Normal heart sounds. No murmur heard. No gallop.   Pulmonary:     Effort: Pulmonary effort is normal.     Breath sounds: Normal breath sounds. No wheezing, rhonchi or rales.  Musculoskeletal:     Cervical back: Normal range of motion and neck supple.  Lymphadenopathy:     Cervical: Cervical adenopathy present.  Skin:    General: Skin is warm and dry.     Capillary Refill: Capillary refill takes less than 2 seconds.     Findings: Lesion present. No erythema or rash.  Neurological:     General: No focal deficit present.     Mental Status: She is alert and oriented to person, place, and time.  Psychiatric:  Mood and Affect: Mood normal.        Behavior: Behavior normal.        Thought Content: Thought content normal.        Judgment: Judgment  normal.      UC Treatments / Results  Labs (all labs ordered are listed, but only abnormal results are displayed) Labs Reviewed - No data to display  EKG   Radiology No results found.  Procedures Procedures (including critical care time)  Medications Ordered in UC Medications - No data to display  Initial Impression / Assessment and Plan / UC Course  I have reviewed the triage vital signs and the nursing notes.  Pertinent labs & imaging results that were available during my care of the patient were reviewed by me and considered in my medical decision making (see chart for details).   Patient is a very pleasant 20 year old female here for evaluation of continued right ear pain x8 days.  She says that pain is also spread to the right side of her face, under her tongue, her throat, down the outside of her neck.  Patient's physical exam reveals a symmetrical face.  Cranial nerves II through XII intact.  Bilateral tympanic membranes are pearly gray with a normal light reflex and clear external auditory canals.  There is tenderness to palpation of the tragus on the right side but no pain with movement of the auricle of the right ear.  Nasal mucosa is pink and moist without erythema, edema, or discharge.  Oropharynx is unremarkable.  Floor the mouth is soft and not firm or boggy.  There is a large isolated lymph node in the anterior cervical region on the right just inferior to the angle of the jaw.  Patient does have a large herpetic lesion on the right lower lip.  Patient does not have any signs of Bell's palsy but suspect she is got facial nerve inflammation causing her pain as evidenced by the lip lesion.  Despite patient's protracted symptoms we will treat with a course of Valtrex, prednisone, and will give tramadol for severe pain.  Final Clinical Impressions(s) / UC Diagnoses   Final diagnoses:  Otalgia of right ear  Right sided facial pain  Herpes labialis     Discharge  Instructions     Take the Valtrex, 2 pills twice daily for total of 2 doses.  Start the prednisone tomorrow morning and take according to the package directions.  Use the tramadol every 6 hours as needed for severe pain.  Do not drive or operate machinery if you take this medication and do not drink alcohol while taking this medication.  If your symptoms do not improve return for reevaluation or contact your primary care provider for referral to neurology.    ED Prescriptions    Medication Sig Dispense Auth. Provider   valACYclovir (VALTREX) 1000 MG tablet Take 2 tablets (2,000 mg total) by mouth 2 (two) times daily for 1 day. 4 tablet Becky Augusta, NP   predniSONE (STERAPRED UNI-PAK 21 TAB) 10 MG (21) TBPK tablet Take 6 tablets on day 1, 5 tablets day 2, 4 tablets day 3, 3 tablets day 4, 2 tablets day 5, 1 tablet day 6 21 tablet Becky Augusta, NP   traMADol (ULTRAM) 50 MG tablet Take 2 tablets (100 mg total) by mouth every 6 (six) hours as needed. 15 tablet Becky Augusta, NP     I have reviewed the PDMP during this encounter.   Becky Augusta, NP 07/12/20 1452

## 2020-07-12 NOTE — ED Triage Notes (Signed)
Patient complains of right ear pain x 8 days. States that she was seen here for this last week and not given any medications. States that pain has continued. States that she also has a cold sore like area on lip that she would like to have looked at.

## 2020-07-12 NOTE — Discharge Instructions (Addendum)
Take the Valtrex, 2 pills twice daily for total of 2 doses.  Start the prednisone tomorrow morning and take according to the package directions.  Use the tramadol every 6 hours as needed for severe pain.  Do not drive or operate machinery if you take this medication and do not drink alcohol while taking this medication.  If your symptoms do not improve return for reevaluation or contact your primary care provider for referral to neurology.

## 2020-07-14 DIAGNOSIS — F4320 Adjustment disorder, unspecified: Secondary | ICD-10-CM | POA: Diagnosis not present

## 2020-07-21 DIAGNOSIS — F4320 Adjustment disorder, unspecified: Secondary | ICD-10-CM | POA: Diagnosis not present

## 2020-07-28 DIAGNOSIS — F4320 Adjustment disorder, unspecified: Secondary | ICD-10-CM | POA: Diagnosis not present

## 2020-08-04 DIAGNOSIS — F4320 Adjustment disorder, unspecified: Secondary | ICD-10-CM | POA: Diagnosis not present

## 2020-08-11 DIAGNOSIS — F4320 Adjustment disorder, unspecified: Secondary | ICD-10-CM | POA: Diagnosis not present

## 2020-08-18 DIAGNOSIS — F4320 Adjustment disorder, unspecified: Secondary | ICD-10-CM | POA: Diagnosis not present

## 2020-08-25 DIAGNOSIS — F4320 Adjustment disorder, unspecified: Secondary | ICD-10-CM | POA: Diagnosis not present

## 2020-09-14 DIAGNOSIS — F4320 Adjustment disorder, unspecified: Secondary | ICD-10-CM | POA: Diagnosis not present

## 2020-09-22 DIAGNOSIS — F4320 Adjustment disorder, unspecified: Secondary | ICD-10-CM | POA: Diagnosis not present

## 2020-09-28 DIAGNOSIS — F4320 Adjustment disorder, unspecified: Secondary | ICD-10-CM | POA: Diagnosis not present

## 2020-10-05 DIAGNOSIS — F4320 Adjustment disorder, unspecified: Secondary | ICD-10-CM | POA: Diagnosis not present

## 2020-10-19 DIAGNOSIS — F4320 Adjustment disorder, unspecified: Secondary | ICD-10-CM | POA: Diagnosis not present

## 2020-10-25 ENCOUNTER — Ambulatory Visit (INDEPENDENT_AMBULATORY_CARE_PROVIDER_SITE_OTHER): Payer: Medicaid Other | Admitting: Family Medicine

## 2020-10-25 ENCOUNTER — Other Ambulatory Visit: Payer: Self-pay

## 2020-10-25 ENCOUNTER — Encounter: Payer: Self-pay | Admitting: Family Medicine

## 2020-10-25 VITALS — BP 98/74 | HR 52 | Temp 98.2°F | Ht 64.0 in | Wt 106.0 lb

## 2020-10-25 DIAGNOSIS — Z0000 Encounter for general adult medical examination without abnormal findings: Secondary | ICD-10-CM | POA: Insufficient documentation

## 2020-10-25 DIAGNOSIS — L709 Acne, unspecified: Secondary | ICD-10-CM | POA: Diagnosis not present

## 2020-10-25 DIAGNOSIS — R5383 Other fatigue: Secondary | ICD-10-CM

## 2020-10-25 DIAGNOSIS — G4486 Cervicogenic headache: Secondary | ICD-10-CM | POA: Diagnosis not present

## 2020-10-25 DIAGNOSIS — Z789 Other specified health status: Secondary | ICD-10-CM | POA: Diagnosis not present

## 2020-10-25 NOTE — Assessment & Plan Note (Signed)
Tension type headache pattern in the setting of depression/anxiety and tight paraspinal cervical musculature. I have advised mindfluness techniques as well as home rehab for neck spasm. She is to contact us in 4-6 weeks for update, if suboptimal progress, anticipate x-rays and follow-up.

## 2020-10-25 NOTE — Patient Instructions (Addendum)
- Obtain blood work with lab orders - Start home exercises for neck - Perform mindfulness with information below - Referral coordinator will reach out regarding dermatology - Maintain follow-up with OBGYN - Contact in 4-6 weeks for update on neck - Our office will contact you with lab results - Reach out for questions  Mindfulness-Based Stress Reduction Mindfulness-based stress reduction (MBSR) is a program that helps you learn to practice mindfulness. Mindfulness is the practice of intentionally paying attention to the present moment. MBSR focuses on developing self-awareness, which allows you to respond to life stress without judgment or negative emotions. It can be learned and practiced through techniques such as education, breathing exercises, meditation, and yoga. MBSR includes several mindfulness techniques in one program. MBSR works best when you understand the treatment, are willing to try new things, and can commit to spending time practicing what you learn.  What are the Benefits of MBSR? MBSR has been shown to have many benefits, which include helping you to: Reduce negative emotions, such as depression and anxiety. Improve memory and focus. Change how you sense and approach pain. Boost your body's ability to fight infections. Help you connect better with other people. Improve your sense of well-being. Follow These Instructions at Home:  Find a local in-person or online MBSR program. Set aside some time regularly for mindfulness practice. Find a mindfulness practice that works best for you. This may include one or more of the following: Meditation. Meditation involves focusing your mind on a certain thought or activity. Breathing awareness exercises. These help you to stay present by focusing on your breath. Body scan. For this practice, you lie down and pay attention to each part of your body from head to toe. You can identify tension and soreness and intentionally relax parts  of your body. Yoga. Yoga involves stretching and breathing, and it can improve your ability to move and be flexible. It can also provide an experience of testing your body's limits, which can help you release stress. Mindful eating. This way of eating involves focusing on the taste, texture, color, and smell of each bite of food. Because this slows down eating and helps you feel full sooner, it can be an important part of a weight-loss plan. Find a podcast or recording that provides guidance for breathing awareness, body scan, or meditation exercises. You can listen to these any time when you have a free moment to rest without distractions. Follow your treatment plan as told by your health care provider. This may include taking regular medicines and making changes to your diet or lifestyle as recommended. How to Practice Mindfulness: To do a basic awareness exercise: Find a comfortable place to sit or lay down. Pay attention to the present moment. Observe your thoughts, feelings, and surroundings just as they are. Avoid placing judgment on yourself, your feelings, or your surroundings. Make note of any judgment that comes up, and let it go. Your mind may wander, and that is okay. Make note of when your thoughts drift, and return your attention to the present moment. To do basic mindfulness meditation: Find a comfortable place to sit or lay down. This may include a stable chair, your bed, or a firm floor cushion. Sit upright with your back straight. Let your arms fall next to your side with your hands resting on your legs. If sitting in a chair, rest your feet flat on the floor. If sitting on a cushion, cross your legs in front of you. If laying down,  let your limbs fully relax. Keep your head in a neutral position with your chin dropped slightly. Relax your jaw and rest the tip of your tongue on the roof of your mouth. Drop your gaze to the floor. You can close your eyes if you like. Breathe  normally and pay attention to your breath. Feel the air moving in and out of your nose. Feel your belly expanding and relaxing with each breath. Your mind may wander, and that is okay. Make note of when your thoughts drift, and return your attention to your breath. Avoid placing judgment on yourself, your feelings, or your surroundings. Make note of any judgment or feelings that come up, let them go, and bring your attention back to your breath. When you are ready, lift your gaze or open your eyes. Pay attention to how your body feels after the meditation.

## 2020-10-25 NOTE — Progress Notes (Signed)
Annual Physical Exam Visit  Patient Information:  Patient ID: Pamela Foster, female DOB: 2000-10-17 Age: 20 y.o. MRN: [id number]   Subjective:   CC: Annual Physical Exam  HPI:  Pamela Foster is here for their annual physical.  I reviewed the past medical history, family history, social history, surgical history, and allergies today and changes were made as necessary.  Please see the problem list section below for additional details.  Past Medical History: Past Medical History:  Diagnosis Date   Adenopathy 06/11/2015   Allergy    Anxiety    Depression    Environmental and seasonal allergies    Recurrent sinus infections 06/11/2015   Past Surgical History: Past Surgical History:  Procedure Laterality Date   INDUCED ABORTION     TONSILLECTOMY AND ADENOIDECTOMY     WISDOM TOOTH EXTRACTION     Social History: Social History   Socioeconomic History   Marital status: Single    Spouse name: Not on file   Number of children: 0   Years of education: 12   Highest education level: High school graduate  Occupational History   Not on file  Tobacco Use   Smoking status: Never   Smokeless tobacco: Never  Vaping Use   Vaping Use: Never used  Substance and Sexual Activity   Alcohol use: Not Currently   Drug use: Not Currently    Types: Marijuana   Sexual activity: Not Currently    Partners: Male    Birth control/protection: None  Other Topics Concern   Not on file              Social Determinants of Health   Financial Resource Strain: Not on file  Food Insecurity: Not on file  Transportation Needs: Not on file  Physical Activity: Not on file  Stress: Not on file  Social Connections: Not on file   Family History: Family History  Problem Relation Age of Onset   Anxiety disorder Mother    Diabetes Mother    Hypertension Mother    Other Father 57       Drowned   Diabetes Maternal Grandmother    Skin cancer Maternal Grandmother    Kidney disease Maternal  Grandmother    Heart attack Maternal Grandfather 93   Breast cancer Paternal Grandmother    Healthy Half-Brother    Healthy Half-Sister    Allergies: No Known Allergies Health Maintenance: Health Maintenance  Topic Date Due   HPV VACCINES (1 - 2-dose series) Never done   HIV Screening  Never done   Hepatitis C Screening  Never done   COVID-19 Vaccine (1) 11/10/2020 (Originally 01/10/2006)   INFLUENZA VACCINE  11/08/2020   CHLAMYDIA SCREENING  12/07/2020   TETANUS/TDAP  01/02/2022   Pneumococcal Vaccine 6-16 Years old  Aged Out    HM Colonoscopy     This patient has no relevant Health Maintenance data.       Medications: No current outpatient medications on file prior to visit.   No current facility-administered medications on file prior to visit.    Review of Systems: + tension headache, visual changes, nausea, vomiting, diarrhea, constipation, dizziness, abdominal pain, +skin rash (acne), fevers, chills, night sweats, swollen lymph nodes, weight loss, chest pain, body aches, joint swelling, muscle aches, shortness of breath, mood changes, visual or auditory hallucinations reported, +fatgiue  Objective:   Vitals:   10/25/20 0944  BP: 98/74  Pulse: (!) 52  Temp: 98.2 F (36.8 C)  SpO2:  99%   Vitals:   10/25/20 0944  Weight: 106 lb (48.1 kg)  Height: 5\' 4"  (1.626 m)   Body mass index is 18.19 kg/m.  General: Well Developed, thin, well nourished, and in no acute distress.  Neuro: Alert and oriented x3, extra-ocular muscles intact, sensation grossly intact. Cranial nerves II through XII are intact, motor, sensory, and coordinative functions are all intact. HEENT: Normocephalic, atraumatic, pupils equal round reactive to light, neck supple, no masses, no lymphadenopathy, thyroid nonpalpable. Oropharynx, nasopharynx, external ear canals are unremarkable. Skin: Warm and dry, no rashes noted. Nodulocystic acne on face Cardiac: Regular rate and rhythm, no murmurs rubs or  gallops. No peripheral edema. Pulses symmetric. Respiratory: Clear to auscultation bilaterally. Not using accessory muscles, speaking in full sentences.  Abdominal: Soft, nontender, nondistended, positive bowel sounds, no masses, no organomegaly. Musculoskeletal: Shoulder, elbow, wrist, hip, knee, ankle stable, and with full range of motion. +spasm bilateral cervical musculature, -Spurling  Female chaperone present throughout physical examination  Impression and Recommendations:   The patient was counselled, risk factors were discussed, and anticipatory guidance given.  Annual physical exam Risk stratification labs ordered, will follow-up accordingly once resulted. Advised anticipatory guidance  Acne Patient interested in dermatology evaluation, a referral was placed.  Cervicogenic headache Tension type headache pattern in the setting of depression/anxiety and tight paraspinal cervical musculature. I have advised mindfluness techniques as well as home rehab for neck spasm. She is to contact in 4-6 weeks for update, if suboptimal progress, anticipate x-rays and follow-up.   Fatigue 3-4 month history of fatigue in the setting of transition to vegan diet. She has maintained variety of vegan food sources and these were brought up. Plan for serum studies to evaluate this further. Will modify plan as clinically guided pending results.  Orders & Medications No orders of the defined types were placed in this encounter.  Orders Placed This Encounter  Procedures   Iron, TIBC and Ferritin Panel   B12 and Folate Panel   CBC with Differential/Platelet   Comprehensive metabolic panel   VITAMIN D 25 Hydroxy (Vit-D Deficiency, Fractures)   Lipid panel   TSH Rfx on Abnormal to Free T4   Food Allergy Profile   Ambulatory referral to Dermatology     Return if symptoms worsen or fail to improve.    [place], MD   Primary Care Sports Medicine [facility]

## 2020-10-25 NOTE — Assessment & Plan Note (Signed)
Risk stratification labs ordered, will follow-up accordingly once resulted. Advised anticipatory guidance

## 2020-10-25 NOTE — Assessment & Plan Note (Signed)
Patient interested in dermatology evaluation, a referral was placed.

## 2020-10-25 NOTE — Assessment & Plan Note (Signed)
3-4 month history of fatigue in the setting of transition to vegan diet. She has maintained variety of vegan food sources and these were brought up. Plan for serum studies to evaluate this further. Will modify plan as clinically guided pending results.

## 2020-10-27 ENCOUNTER — Encounter: Payer: Self-pay | Admitting: Family Medicine

## 2020-10-27 ENCOUNTER — Other Ambulatory Visit: Payer: Self-pay | Admitting: Family Medicine

## 2020-10-27 DIAGNOSIS — F4320 Adjustment disorder, unspecified: Secondary | ICD-10-CM | POA: Diagnosis not present

## 2020-10-27 DIAGNOSIS — R7989 Other specified abnormal findings of blood chemistry: Secondary | ICD-10-CM

## 2020-10-27 LAB — CBC WITH DIFFERENTIAL/PLATELET
Basophils Absolute: 0.1 10*3/uL (ref 0.0–0.2)
Basos: 2 %
EOS (ABSOLUTE): 0.5 10*3/uL — ABNORMAL HIGH (ref 0.0–0.4)
Eos: 14 %
Hematocrit: 41.9 % (ref 34.0–46.6)
Hemoglobin: 13.8 g/dL (ref 11.1–15.9)
Immature Grans (Abs): 0 10*3/uL (ref 0.0–0.1)
Immature Granulocytes: 0 %
Lymphocytes Absolute: 1.2 10*3/uL (ref 0.7–3.1)
Lymphs: 34 %
MCH: 29.2 pg (ref 26.6–33.0)
MCHC: 32.9 g/dL (ref 31.5–35.7)
MCV: 89 fL (ref 79–97)
Monocytes Absolute: 0.3 10*3/uL (ref 0.1–0.9)
Monocytes: 10 %
Neutrophils Absolute: 1.4 10*3/uL (ref 1.4–7.0)
Neutrophils: 40 %
Platelets: 144 10*3/uL — ABNORMAL LOW (ref 150–450)
RBC: 4.72 x10E6/uL (ref 3.77–5.28)
RDW: 11.9 % (ref 11.7–15.4)
WBC: 3.5 10*3/uL (ref 3.4–10.8)

## 2020-10-27 LAB — COMPREHENSIVE METABOLIC PANEL
ALT: 36 IU/L — ABNORMAL HIGH (ref 0–32)
AST: 29 IU/L (ref 0–40)
Albumin/Globulin Ratio: 2 (ref 1.2–2.2)
Albumin: 4.5 g/dL (ref 3.9–5.0)
Alkaline Phosphatase: 88 IU/L (ref 42–106)
BUN/Creatinine Ratio: 15 (ref 9–23)
BUN: 10 mg/dL (ref 6–20)
Bilirubin Total: 0.4 mg/dL (ref 0.0–1.2)
CO2: 24 mmol/L (ref 20–29)
Calcium: 9.3 mg/dL (ref 8.7–10.2)
Chloride: 103 mmol/L (ref 96–106)
Creatinine, Ser: 0.68 mg/dL (ref 0.57–1.00)
Globulin, Total: 2.3 g/dL (ref 1.5–4.5)
Glucose: 73 mg/dL (ref 65–99)
Potassium: 4.5 mmol/L (ref 3.5–5.2)
Sodium: 140 mmol/L (ref 134–144)
Total Protein: 6.8 g/dL (ref 6.0–8.5)
eGFR: 129 mL/min/{1.73_m2} (ref >59)

## 2020-10-27 LAB — LIPID PANEL
Chol/HDL Ratio: 2 ratio (ref 0.0–4.4)
Cholesterol, Total: 119 mg/dL (ref 100–169)
HDL: 60 mg/dL (ref >39)
LDL Chol Calc (NIH): 50 mg/dL (ref 0–109)
Triglycerides: 32 mg/dL (ref 0–89)
VLDL Cholesterol Cal: 9 mg/dL (ref 5–40)

## 2020-10-27 LAB — FOOD ALLERGY PROFILE
Allergen Corn, IgE: <0.1 kU/L
Clam IgE: <0.1 kU/L
Codfish IgE: <0.1 kU/L
Egg White IgE: <0.1 kU/L
Milk IgE: <0.1 kU/L
Peanut IgE: 0.1 kU/L — AB
Scallop IgE: <0.1 kU/L
Sesame Seed IgE: <0.1 kU/L
Shrimp IgE: <0.1 kU/L
Soybean IgE: <0.1 kU/L
Walnut IgE: <0.1 kU/L
Wheat IgE: <0.1 kU/L

## 2020-10-27 LAB — IRON,TIBC AND FERRITIN PANEL
Ferritin: 23 ng/mL (ref 15–77)
Iron Saturation: 19 % (ref 15–55)
Iron: 75 ug/dL (ref 27–159)
Total Iron Binding Capacity: 393 ug/dL (ref 250–450)
UIBC: 318 ug/dL (ref 131–425)

## 2020-10-27 LAB — B12 AND FOLATE PANEL
Folate: 18 ng/mL (ref >3.0)
Vitamin B-12: 328 pg/mL (ref 232–1245)

## 2020-10-27 LAB — VITAMIN D 25 HYDROXY (VIT D DEFICIENCY, FRACTURES): Vit D, 25-Hydroxy: 28.9 ng/mL — ABNORMAL LOW (ref 30.0–100.0)

## 2020-10-27 LAB — TSH RFX ON ABNORMAL TO FREE T4: TSH: 0.777 u[IU]/mL (ref 0.450–4.500)

## 2020-10-27 MED ORDER — VITAMIN D (ERGOCALCIFEROL) 1.25 MG (50000 UNIT) PO CAPS: 50000.0000 [IU] | ORAL_CAPSULE | ORAL | 0 refills | Status: DC

## 2020-10-27 NOTE — Progress Notes (Signed)
Your labs show mildly low platelets (they have gone up from the last check 11 months ago), barely elevated eosinophils and ALT, and low vitamin D. Lastly, the food allergy profile shows "equivocal/low" response to peanut.  In regards to what we do, the best thing to do is a recheck some labs in about 1 month to see if they are still out of range.  Additionally, if you have tolerated peanut / peanut foods in the past without issue, then I would disregard the result as it is borderline.  Lastly, the vitamin D is low enough that we need to start a course of Rx vitamin D weekly. I will send this to your pharmacy.  Reach out for questions.

## 2020-11-02 DIAGNOSIS — F4320 Adjustment disorder, unspecified: Secondary | ICD-10-CM | POA: Diagnosis not present

## 2020-11-11 DIAGNOSIS — L700 Acne vulgaris: Secondary | ICD-10-CM | POA: Diagnosis not present

## 2020-11-23 DIAGNOSIS — F4320 Adjustment disorder, unspecified: Secondary | ICD-10-CM | POA: Diagnosis not present

## 2020-11-30 DIAGNOSIS — F4320 Adjustment disorder, unspecified: Secondary | ICD-10-CM | POA: Diagnosis not present

## 2020-12-07 DIAGNOSIS — F4320 Adjustment disorder, unspecified: Secondary | ICD-10-CM | POA: Diagnosis not present

## 2020-12-14 DIAGNOSIS — F4320 Adjustment disorder, unspecified: Secondary | ICD-10-CM | POA: Diagnosis not present

## 2020-12-21 DIAGNOSIS — F4320 Adjustment disorder, unspecified: Secondary | ICD-10-CM | POA: Diagnosis not present

## 2020-12-28 DIAGNOSIS — F4320 Adjustment disorder, unspecified: Secondary | ICD-10-CM | POA: Diagnosis not present

## 2021-01-04 DIAGNOSIS — F4320 Adjustment disorder, unspecified: Secondary | ICD-10-CM | POA: Diagnosis not present

## 2021-01-18 DIAGNOSIS — F4320 Adjustment disorder, unspecified: Secondary | ICD-10-CM | POA: Diagnosis not present

## 2021-01-26 ENCOUNTER — Ambulatory Visit
Admission: EM | Admit: 2021-01-26 | Discharge: 2021-01-26 | Disposition: A | Discharge status: Home / Self Care | Payer: Medicaid Other | Attending: Physician Assistant | Admitting: Physician Assistant

## 2021-01-26 ENCOUNTER — Other Ambulatory Visit: Payer: Self-pay

## 2021-01-26 DIAGNOSIS — J069 Acute upper respiratory infection, unspecified: Secondary | ICD-10-CM | POA: Diagnosis not present

## 2021-01-26 DIAGNOSIS — U071 COVID-19: Secondary | ICD-10-CM

## 2021-01-26 LAB — RESP PANEL BY RT-PCR (FLU A&B, COVID) ARPGX2
Influenza A by PCR: NEGATIVE
Influenza B by PCR: NEGATIVE
SARS Coronavirus 2 by RT PCR: POSITIVE — AB

## 2021-01-26 NOTE — ED Triage Notes (Signed)
Pt here with C/O body aches, fever, N/V/D, chest congestion, that started Saturday, Fever, N/V/D, body aches have since resolved.

## 2021-01-26 NOTE — Discharge Instructions (Addendum)
COVID + Isolate all day tomorrow and then wear a mask x 5 days  You have received COVID testing today either for positive exposure, concerning symptoms that could be related to COVID infection, screening purposes, or re-testing after confirmed positive.  Your test obtained today checks for active viral infection in the last 1-2 weeks. If your test is negative now, you can still test positive later. So, if you do develop symptoms you should either get re-tested and/or isolate x 5 days and then strict mask use x 5 days (unvaccinated) or mask use x 10 days (vaccinated). Please follow CDC guidelines.  While Rapid antigen tests come back in 15-20 minutes, send out PCR/molecular test results typically come back within 1-3 days. In the mean time, if you are symptomatic, assume this could be a positive test and treat/monitor yourself as if you do have COVID.   We will call with test results if positive. Please download the MyChart app and set up a profile to access test results.   If symptomatic, go home and rest. Push fluids. Take Tylenol as needed for discomfort. Gargle warm salt water. Throat lozenges. Take Mucinex DM or Robitussin for cough. Humidifier in bedroom to ease coughing. Warm showers. Also review the COVID handout for more information.  COVID-19 INFECTION: The incubation period of COVID-19 is approximately 14 days after exposure, with most symptoms developing in roughly 4-5 days. Symptoms may range in severity from mild to critically severe. Roughly 80% of those infected will have mild symptoms. People of any age may become infected with COVID-19 and have the ability to transmit the virus. The most common symptoms include: fever, fatigue, cough, body aches, headaches, sore throat, nasal congestion, shortness of breath, nausea, vomiting, diarrhea, changes in smell and/or taste.    COURSE OF ILLNESS Some patients may begin with mild disease which can progress quickly into critical symptoms. If your  symptoms are worsening please call ahead to the Emergency Department and proceed there for further treatment. Recovery time appears to be roughly 1-2 weeks for mild symptoms and 3-6 weeks for severe disease.   GO IMMEDIATELY TO ER FOR FEVER YOU ARE UNABLE TO GET DOWN WITH TYLENOL, BREATHING PROBLEMS, CHEST PAIN, FATIGUE, LETHARGY, INABILITY TO EAT OR DRINK, ETC  QUARANTINE AND ISOLATION: To help decrease the spread of COVID-19 please remain isolated if you have COVID infection or are highly suspected to have COVID infection. This means -stay home and isolate to one room in the home if you live with others. Do not share a bed or bathroom with others while ill, sanitize and wipe down all countertops and keep common areas clean and disinfected. Stay home for 5 days. If you have no symptoms or your symptoms are resolving after 5 days, you can leave your house. Continue to wear a mask around others for 5 additional days. If you have been in close contact (within 6 feet) of someone diagnosed with COVID 19, you are advised to quarantine in your home for 14 days as symptoms can develop anywhere from 2-14 days after exposure to the virus. If you develop symptoms, you  must isolate.  Most current guidelines for COVID after exposure -unvaccinated: isolate 5 days and strict mask use x 5 days. Test on day 5 is possible -vaccinated: wear mask x 10 days if symptoms do not develop -You do not necessarily need to be tested for COVID if you have + exposure and  develop symptoms. Just isolate at home x10 days from symptom onset  During this global pandemic, CDC advises to practice social distancing, try to stay at least 55ft away from others at all times. Wear a face covering. Wash and sanitize your hands regularly and avoid going anywhere that is not necessary.  KEEP IN MIND THAT THE COVID TEST IS NOT 100% ACCURATE AND YOU SHOULD STILL DO EVERYTHING TO PREVENT POTENTIAL SPREAD OF VIRUS TO OTHERS (WEAR MASK, WEAR GLOVES,  WASH HANDS AND SANITIZE REGULARLY). IF INITIAL TEST IS NEGATIVE, THIS MAY NOT MEAN YOU ARE DEFINITELY NEGATIVE. MOST ACCURATE TESTING IS DONE 5-7 DAYS AFTER EXPOSURE.   It is not advised by CDC to get re-tested after receiving a positive COVID test since you can still test positive for weeks to months after you have already cleared the virus.   *If you have not been vaccinated for COVID, I strongly suggest you consider getting vaccinated as long as there are no contraindications.

## 2021-01-26 NOTE — ED Provider Notes (Signed)
[facility]    CSN: [id number] Arrival date & time: 01/26/21  1308      History   Chief Complaint Chief Complaint  Patient presents with   Cough    HPI Pamela Foster is a 20 y.o. female presenting for fever to 102 degrees, body aches, fatigue, chest pain, nausea with vomiting, sore throats, cough and congestion.  Symptom onset 4 days ago.  Patient denies any fever in the past 24 hours.  Has not been taking any antipyretics in the past couple of days.  Temperature currently 99.3 degrees.  Denies any breathing difficulty.  No flu or COVID exposure to her knowledge.  Has not been taking any OTC meds besides Tylenol that she took the first couple of days.  Patient reports feeling better today than she did the past couple of days.  No other complaints.  HPI  Past Medical History:  Diagnosis Date   Adenopathy 06/11/2015   Allergy    Anxiety    Depression    Environmental and seasonal allergies    Recurrent sinus infections 06/11/2015    Patient Active Problem List   Diagnosis Date Noted   Annual physical exam 10/25/2020   Cervicogenic headache 10/25/2020   Fatigue 10/25/2020   Acne 10/25/2020   Encounter for supervision of normal first pregnancy in first trimester 05/18/2020   Pelvic and perineal pain 03/22/2020   Sleep difficulties 07/17/2015   Inattention 06/17/2015   Aphthae 06/11/2015   Headache disorder 06/11/2015   Nocturnal enuresis 06/11/2015   Allergic rhinitis, seasonal 06/11/2015   Migraine without aura and without status migrainosus, not intractable 01/05/2015   Episodic tension-type headache, not intractable 01/05/2015    Past Surgical History:  Procedure Laterality Date   INDUCED ABORTION     TONSILLECTOMY AND ADENOIDECTOMY     WISDOM TOOTH EXTRACTION      OB History     Gravida  2   Para  0   Term  0   Preterm  0   AB  1   Living         SAB  0   IAB  1   Ectopic  0   Multiple      Live Births               Home  Medications    Prior to Admission medications   Medication Sig Start Date End Date Taking? Authorizing Provider  Vitamin D, Ergocalciferol, (DRISDOL) 1.25 MG (50000 UNIT) CAPS capsule Take 1 capsule (50,000 Units total) by mouth every 7 (seven) days. Take for 8 total doses(weeks) 10/27/20   [name], MD    Family History Family History  Problem Relation Age of Onset   Anxiety disorder Mother    Diabetes Mother    Hypertension Mother    Other Father 28       Drowned   Diabetes Maternal Grandmother    Skin cancer Maternal Grandmother    Kidney disease Maternal Grandmother    Heart attack Maternal Grandfather 43   Breast cancer Paternal Grandmother    Healthy Half-Brother    Healthy Half-Sister     Social History Social History   Tobacco Use   Smoking status: Never   Smokeless tobacco: Never  Vaping Use   Vaping Use: Never used  Substance Use Topics   Alcohol use: Not Currently   Drug use: Not Currently    Types: Marijuana     Allergies   Patient has no known  allergies.   Review of Systems Review of Systems  Constitutional:  Positive for fatigue and fever. Negative for chills and diaphoresis.  HENT:  Positive for congestion, rhinorrhea and sore throat. Negative for ear pain, sinus pressure and sinus pain.   Respiratory:  Positive for cough. Negative for shortness of breath.   Cardiovascular:  Positive for chest pain.  Gastrointestinal:  Positive for diarrhea, nausea and vomiting. Negative for abdominal pain.  Musculoskeletal:  Positive for myalgias. Negative for arthralgias.  Skin:  Negative for rash.  Neurological:  Negative for weakness and headaches.  Hematological:  Negative for adenopathy.    Physical Exam Triage Vital Signs ED Triage Vitals  Enc Vitals Group     BP 01/26/21 1349 111/77     Pulse Rate 01/26/21 1349 91     Resp 01/26/21 1349 18     Temp 01/26/21 1349 99.3 F (37.4 C)     Temp Source 01/26/21 1349 Oral     SpO2 01/26/21 1349  96 %     Weight 01/26/21 1347 110 lb (49.9 kg)     Height 01/26/21 1347 5\' 4"  (1.626 m)     Head Circumference --      Peak Flow --      Pain Score 01/26/21 1347 0     Pain Loc --      Pain Edu? --      Excl. in GC? --    No data found.  Updated Vital Signs BP 111/77 (BP Location: Left Arm)   Pulse 91   Temp 99.3 F (37.4 C) (Oral)   Resp 18   Ht 5\' 4"  (1.626 m)   Wt 110 lb (49.9 kg)   LMP 01/18/2021 (Exact Date) Comment: normal period  SpO2 96%   BMI 18.88 kg/m     Physical Exam Vitals and nursing note reviewed.  Constitutional:      General: She is not in acute distress.    Appearance: Normal appearance. She is not ill-appearing or toxic-appearing.  HENT:     Head: Normocephalic and atraumatic.     Nose: Congestion present.     Mouth/Throat:     Mouth: Mucous membranes are moist.     Pharynx: Oropharynx is clear.  Eyes:     General: No scleral icterus.       Right eye: No discharge.        Left eye: No discharge.     Conjunctiva/sclera: Conjunctivae normal.  Cardiovascular:     Rate and Rhythm: Normal rate and regular rhythm.     Heart sounds: Normal heart sounds.  Pulmonary:     Effort: Pulmonary effort is normal. No respiratory distress.     Breath sounds: Normal breath sounds.  Musculoskeletal:     Cervical back: Neck supple.  Skin:    General: Skin is dry.  Neurological:     General: No focal deficit present.     Mental Status: She is alert. Mental status is at baseline.     Motor: No weakness.     Gait: Gait normal.  Psychiatric:        Mood and Affect: Mood normal.        Behavior: Behavior normal.        Thought Content: Thought content normal.     UC Treatments / Results  Labs (all labs ordered are listed, but only abnormal results are displayed) Labs Reviewed  RESP PANEL BY RT-PCR (FLU A&B, COVID) ARPGX2 - Abnormal; Notable for the following components:  Result Value   SARS Coronavirus 2 by RT PCR POSITIVE (*)    All other  components within normal limits    EKG   Radiology No results found.  Procedures Procedures (including critical care time)  Medications Ordered in UC Medications - No data to display  Initial Impression / Assessment and Plan / UC Course  I have reviewed the triage vital signs and the nursing notes.  Pertinent labs & imaging results that were available during my care of the patient were reviewed by me and considered in my medical decision making (see chart for details).    20 year old female presenting for 4-day history of feeling ill.  Reports fever to 102 degrees but has not however the past 24 hours.  Patient also reports nausea/vomiting/diarrhea, cough, congestion, sore throat, body aches.  Also reports chest pain denies any breathing difficulty.  No history of cardiopulmonary disease.  Vitals all normal and stable and she is overall well-appearing today.  Exam severe for minor nasal congestion.  Chest clear to auscultation heart regular rate and rhythm.  Respiratory panel ordered today to assess for influenza and COVID-19 given the fever with the symptoms.  Respiratory panel positive for COVID-19.  Reviewed this with patient.  Reviewed current CDC guidelines, isolation protocol and ED precautions with patient.  Supportive care encouraged with increasing rest and fluids.  I did offer cough medication but she declines at this time.  Advised OTC cough meds.  Work note given.  Follow-up here as needed.  Final Clinical Impressions(s) / UC Diagnoses   Final diagnoses:  Viral URI with cough  COVID-19     Discharge Instructions      COVID + Isolate all day tomorrow and then wear a mask x 5 days  You have received COVID testing today either for positive exposure, concerning symptoms that could be related to COVID infection, screening purposes, or re-testing after confirmed positive.  Your test obtained today checks for active viral infection in the last 1-2 weeks. If your test is  negative now, you can still test positive later. So, if you do develop symptoms you should either get re-tested and/or isolate x 5 days and then strict mask use x 5 days (unvaccinated) or mask use x 10 days (vaccinated). Please follow CDC guidelines.  While Rapid antigen tests come back in 15-20 minutes, send out PCR/molecular test results typically come back within 1-3 days. In the mean time, if you are symptomatic, assume this could be a positive test and treat/monitor yourself as if you do have COVID.   We will call with test results if positive. Please download the MyChart app and set up a profile to access test results.   If symptomatic, go home and rest. Push fluids. Take Tylenol as needed for discomfort. Gargle warm salt water. Throat lozenges. Take Mucinex DM or Robitussin for cough. Humidifier in bedroom to ease coughing. Warm showers. Also review the COVID handout for more information.  COVID-19 INFECTION: The incubation period of COVID-19 is approximately 14 days after exposure, with most symptoms developing in roughly 4-5 days. Symptoms may range in severity from mild to critically severe. Roughly 80% of those infected will have mild symptoms. People of any age may become infected with COVID-19 and have the ability to transmit the virus. The most common symptoms include: fever, fatigue, cough, body aches, headaches, sore throat, nasal congestion, shortness of breath, nausea, vomiting, diarrhea, changes in smell and/or taste.    COURSE OF ILLNESS Some patients may begin  with mild disease which can progress quickly into critical symptoms. If your symptoms are worsening please call ahead to the Emergency Department and proceed there for further treatment. Recovery time appears to be roughly 1-2 weeks for mild symptoms and 3-6 weeks for severe disease.   GO IMMEDIATELY TO ER FOR FEVER YOU ARE UNABLE TO GET DOWN WITH TYLENOL, BREATHING PROBLEMS, CHEST PAIN, FATIGUE, LETHARGY, INABILITY TO EAT OR  DRINK, ETC  QUARANTINE AND ISOLATION: To help decrease the spread of COVID-19 please remain isolated if you have COVID infection or are highly suspected to have COVID infection. This means -stay home and isolate to one room in the home if you live with others. Do not share a bed or bathroom with others while ill, sanitize and wipe down all countertops and keep common areas clean and disinfected. Stay home for 5 days. If you have no symptoms or your symptoms are resolving after 5 days, you can leave your house. Continue to wear a mask around others for 5 additional days. If you have been in close contact (within 6 feet) of someone diagnosed with COVID 19, you are advised to quarantine in your home for 14 days as symptoms can develop anywhere from 2-14 days after exposure to the virus. If you develop symptoms, you  must isolate.  Most current guidelines for COVID after exposure -unvaccinated: isolate 5 days and strict mask use x 5 days. Test on day 5 is possible -vaccinated: wear mask x 10 days if symptoms do not develop -You do not necessarily need to be tested for COVID if you have + exposure and  develop symptoms. Just isolate at home x10 days from symptom onset During this global pandemic, CDC advises to practice social distancing, try to stay at least 34ft away from others at all times. Wear a face covering. Wash and sanitize your hands regularly and avoid going anywhere that is not necessary.  KEEP IN MIND THAT THE COVID TEST IS NOT 100% ACCURATE AND YOU SHOULD STILL DO EVERYTHING TO PREVENT POTENTIAL SPREAD OF VIRUS TO OTHERS (WEAR MASK, WEAR GLOVES, WASH HANDS AND SANITIZE REGULARLY). IF INITIAL TEST IS NEGATIVE, THIS MAY NOT MEAN YOU ARE DEFINITELY NEGATIVE. MOST ACCURATE TESTING IS DONE 5-7 DAYS AFTER EXPOSURE.   It is not advised by CDC to get re-tested after receiving a positive COVID test since you can still test positive for weeks to months after you have already cleared the virus.   *If  you have not been vaccinated for COVID, I strongly suggest you consider getting vaccinated as long as there are no contraindications.     ED Prescriptions   None    PDMP not reviewed this encounter.   Shirlee Latch, PA-C 01/26/21 1511

## 2021-02-01 DIAGNOSIS — F4320 Adjustment disorder, unspecified: Secondary | ICD-10-CM | POA: Diagnosis not present

## 2021-02-08 DIAGNOSIS — F4320 Adjustment disorder, unspecified: Secondary | ICD-10-CM | POA: Diagnosis not present

## 2021-02-11 ENCOUNTER — Telehealth: Payer: Self-pay | Admitting: Family Medicine

## 2021-02-11 ENCOUNTER — Encounter: Payer: Self-pay | Admitting: Family Medicine

## 2021-02-11 DIAGNOSIS — M25552 Pain in left hip: Secondary | ICD-10-CM

## 2021-02-11 DIAGNOSIS — M25551 Pain in right hip: Secondary | ICD-10-CM

## 2021-02-11 NOTE — Telephone Encounter (Signed)
X-Ray orders placed.  Please call patient to schedule new ortho appointment for bilateral hip pain and she can obtain X-Rays downstairs a day prior or an hour prior to her appointment.

## 2021-02-14 ENCOUNTER — Ambulatory Visit
Admission: RE | Admit: 2021-02-14 | Discharge: 2021-02-14 | Disposition: A | Discharge status: Home / Self Care | Payer: Medicaid Other | Attending: Family Medicine | Admitting: Family Medicine

## 2021-02-14 ENCOUNTER — Ambulatory Visit
Admission: RE | Admit: 2021-02-14 | Discharge: 2021-02-14 | Disposition: A | Discharge status: Home / Self Care | Payer: Medicaid Other | Source: Ambulatory Visit | Attending: Family Medicine | Admitting: Family Medicine

## 2021-02-14 ENCOUNTER — Encounter: Payer: Self-pay | Admitting: Family Medicine

## 2021-02-14 ENCOUNTER — Other Ambulatory Visit: Payer: Self-pay

## 2021-02-14 ENCOUNTER — Ambulatory Visit (INDEPENDENT_AMBULATORY_CARE_PROVIDER_SITE_OTHER): Payer: Medicaid Other | Admitting: Family Medicine

## 2021-02-14 VITALS — BP 94/62 | HR 64 | Ht 64.0 in | Wt 107.0 lb

## 2021-02-14 DIAGNOSIS — M25551 Pain in right hip: Secondary | ICD-10-CM

## 2021-02-14 DIAGNOSIS — K5904 Chronic idiopathic constipation: Secondary | ICD-10-CM

## 2021-02-14 DIAGNOSIS — M7602 Gluteal tendinitis, left hip: Secondary | ICD-10-CM | POA: Insufficient documentation

## 2021-02-14 DIAGNOSIS — E559 Vitamin D deficiency, unspecified: Secondary | ICD-10-CM | POA: Diagnosis not present

## 2021-02-14 DIAGNOSIS — N912 Amenorrhea, unspecified: Secondary | ICD-10-CM | POA: Diagnosis not present

## 2021-02-14 DIAGNOSIS — M545 Low back pain, unspecified: Secondary | ICD-10-CM | POA: Diagnosis not present

## 2021-02-14 DIAGNOSIS — M7632 Iliotibial band syndrome, left leg: Secondary | ICD-10-CM | POA: Insufficient documentation

## 2021-02-14 DIAGNOSIS — M25552 Pain in left hip: Secondary | ICD-10-CM | POA: Diagnosis not present

## 2021-02-14 LAB — POCT URINE PREGNANCY: Preg Test, Ur: NEGATIVE

## 2021-02-14 MED ORDER — POLYETHYLENE GLYCOL 3350 17 GM/SCOOP PO POWD: 17.0000 g | Freq: Two times a day (BID) | ORAL | 1 refills | Status: DC | PRN

## 2021-02-14 MED ORDER — MELOXICAM 7.5 MG PO TABS: 7.5000 mg | ORAL_TABLET | Freq: Every day | ORAL | 0 refills | Status: DC

## 2021-02-14 NOTE — Assessment & Plan Note (Signed)
See additional assessment(s) for plan details. 

## 2021-02-14 NOTE — Assessment & Plan Note (Signed)
Incidentally noted significant stool burden throughout the large bowel recent lumbar spine x-rays, states this is a chronic issue, has dealt with the same in childhood with MiraLAX.  She does describe significant daily fiber and moderate water intake.  I have advised supportive care with dietary change, increased hydration, and MiraLAX until follow-up.

## 2021-02-14 NOTE — Assessment & Plan Note (Signed)
Recently noted on serum studies, Rx sent at last visit, patient has yet to pick up, utility of vitamin D and adequate supplementation reviewed.  She is amenable to starting this.

## 2021-02-14 NOTE — Assessment & Plan Note (Signed)
Roughly 1 month history of left lateral hip pain in the setting of greater than 1 year of right hip pain.  Examination findings shows focal tenderness at the gluteus medius tendons at the insertion, somewhat to the greater trochanter, IT band positive Ober test, negative straight leg raise bilaterally.  Have advised formal physical therapy, scheduled meloxicam, and follow-up in 6 weeks.  Pending symptoms that time injection versus advanced imaging to be considered.

## 2021-02-14 NOTE — Progress Notes (Signed)
Primary Care / Sports Medicine Office Visit  Patient Information:  Patient ID: Pamela Foster, female DOB: 10/26/2000 Age: 20 y.o. MRN: [id number]   Pamela Foster is a pleasant 20 y.o. female presenting with the following:  Chief Complaint  Patient presents with   Hip Pain    Bilateral; limited ROM; left>right; 6/10 pain    Review of Systems pertinent details above   Patient Active Problem List   Diagnosis Date Noted   Arthralgia of right hip 02/14/2021   Gluteal tendinitis, left hip 02/14/2021   It band syndrome, left 02/14/2021   Chronic idiopathic constipation 02/14/2021   Vitamin D deficiency 02/14/2021   Annual physical exam 10/25/2020   Cervicogenic headache 10/25/2020   Fatigue 10/25/2020   Acne 10/25/2020   Encounter for supervision of normal first pregnancy in first trimester 05/18/2020   Pelvic and perineal pain 03/22/2020   Sleep difficulties 07/17/2015   Inattention 06/17/2015   Aphthae 06/11/2015   Headache disorder 06/11/2015   Nocturnal enuresis 06/11/2015   Allergic rhinitis, seasonal 06/11/2015   Migraine without aura and without status migrainosus, not intractable 01/05/2015   Episodic tension-type headache, not intractable 01/05/2015   Past Medical History:  Diagnosis Date   Adenopathy 06/11/2015   Allergy    Anxiety    Depression    Environmental and seasonal allergies    Recurrent sinus infections 06/11/2015   Outpatient Encounter Medications as of 02/14/2021  Medication Sig   meloxicam (MOBIC) 7.5 MG tablet Take 1 tablet (7.5 mg total) by mouth daily.   polyethylene glycol powder (GLYCOLAX/MIRALAX) 17 GM/SCOOP powder Take 17 g by mouth 2 (two) times daily as needed.   Clindamycin-Benzoyl Per, Refr, gel Apply 1 application topically daily. (Patient not taking: Reported on 02/14/2021)   RETIN-A 0.05 % cream Apply 1 application topically at bedtime. (Patient not taking: Reported on 02/14/2021)   Vitamin D, Ergocalciferol, (DRISDOL) 1.25 MG (50000  UNIT) CAPS capsule Take 1 capsule (50,000 Units total) by mouth every 7 (seven) days. Take for 8 total doses(weeks) (Patient not taking: Reported on 02/14/2021)   No facility-administered encounter medications on file as of 02/14/2021.   Past Surgical History:  Procedure Laterality Date   INDUCED ABORTION     TONSILLECTOMY AND ADENOIDECTOMY     WISDOM TOOTH EXTRACTION      Vitals:   02/14/21 1558  BP: 94/62  Pulse: 64  SpO2: 98%   Vitals:   02/14/21 1558  Weight: 107 lb (48.5 kg)  Height: 5\' 4"  (1.626 m)   Body mass index is 18.37 kg/m.  No results found.   Independent interpretation of notes and tests performed by another provider:   Independent interpretation of lumbar, bilateral hip, and AP pelvis x-rays from 02/14/2021 reviewed revealing straightening of the expected lumbar lordosis, significant stool burden throughout the large bowel, no osseous abnormalities of bilateral hips, subtle inferior right hip joint narrowing on weightbearing view, no pincer or cam deformity noted, no acute osseous processes identified.  Procedures performed:   None  Pertinent History, Exam, Impression, and Recommendations:   Chronic idiopathic constipation Incidentally noted significant stool burden throughout the large bowel recent lumbar spine x-rays, states this is a chronic issue, has dealt with the same in childhood with MiraLAX.  She does describe significant daily fiber and moderate water intake.  I have advised supportive care with dietary change, increased hydration, and MiraLAX until follow-up.  Vitamin D deficiency Recently noted on serum studies, Rx sent at last visit, patient  has yet to pick up, utility of vitamin D and adequate supplementation reviewed.  She is amenable to starting this.  Arthralgia of right hip Chronic issue ongoing for over 1 year with persistent symptomatology.  Examination is focal to the right hip joint with FADIR testing, negative stressing of the psoas,  etiology can include generalized by mechanic issues, lack of stabilization, labral pathology.  I have advised scheduled meloxicam 7.5 mg x 4 weeks, formal PT, and follow-up in 6 weeks for reevaluation.  If persistent symptomatology noted, advanced imaging versus local injection to be considered.  Gluteal tendinitis, left hip Roughly 1 month history of left lateral hip pain in the setting of greater than 1 year of right hip pain.  Examination findings shows focal tenderness at the gluteus medius tendons at the insertion, somewhat to the greater trochanter, IT band positive Ober test, negative straight leg raise bilaterally.  Have advised formal physical therapy, scheduled meloxicam, and follow-up in 6 weeks.  Pending symptoms that time injection versus advanced imaging to be considered.  It band syndrome, left See additional assessment(s) for plan details.   Orders & Medications Meds ordered this encounter  Medications   meloxicam (MOBIC) 7.5 MG tablet    Sig: Take 1 tablet (7.5 mg total) by mouth daily.    Dispense:  30 tablet    Refill:  0   polyethylene glycol powder (GLYCOLAX/MIRALAX) 17 GM/SCOOP powder    Sig: Take 17 g by mouth 2 (two) times daily as needed.    Dispense:  3350 g    Refill:  1   Orders Placed This Encounter  Procedures   Ambulatory referral to Physical Therapy   POCT urine pregnancy     Return in about 6 years (around 02/15/2027).     [name], MD   Primary Care Sports Medicine [facility]

## 2021-02-14 NOTE — Assessment & Plan Note (Signed)
Chronic issue ongoing for over 1 year with persistent symptomatology.  Examination is focal to the right hip joint with FADIR testing, negative stressing of the psoas, etiology can include generalized by mechanic issues, lack of stabilization, labral pathology.  I have advised scheduled meloxicam 7.5 mg x 4 weeks, formal PT, and follow-up in 6 weeks for reevaluation.  If persistent symptomatology noted, advanced imaging versus local injection to be considered.

## 2021-02-14 NOTE — Patient Instructions (Signed)
-   Start meloxicam, take for 4 weeks with food - Start physical therapy and home exercises - Start MiraLax daily - Increase water intake - Return in 6 weeks, contact for questions

## 2021-02-15 DIAGNOSIS — F4320 Adjustment disorder, unspecified: Secondary | ICD-10-CM | POA: Diagnosis not present

## 2021-02-22 DIAGNOSIS — F4320 Adjustment disorder, unspecified: Secondary | ICD-10-CM | POA: Diagnosis not present

## 2021-02-24 ENCOUNTER — Encounter: Payer: Self-pay | Admitting: Emergency Medicine

## 2021-02-24 ENCOUNTER — Other Ambulatory Visit: Payer: Self-pay

## 2021-02-24 ENCOUNTER — Ambulatory Visit
Admission: EM | Admit: 2021-02-24 | Discharge: 2021-02-24 | Disposition: A | Discharge status: Home / Self Care | Payer: Medicaid Other | Attending: Emergency Medicine | Admitting: Emergency Medicine

## 2021-02-24 DIAGNOSIS — N926 Irregular menstruation, unspecified: Secondary | ICD-10-CM | POA: Diagnosis not present

## 2021-02-24 LAB — HCG, QUANTITATIVE, PREGNANCY: hCG, Beta Chain, Quant, S: <5 m[IU]/mL (ref <5)

## 2021-02-24 NOTE — ED Provider Notes (Signed)
[facility]    CSN: [id number] Arrival date & time: 02/24/21  1613      History   Chief Complaint Chief Complaint  Patient presents with   Possible Pregnancy    HPI Pamela Foster is a 20 y.o. female.   Patient presents requesting pregnancy testing and blood work.  Home pregnancy test negative x2.  Endorses menses is 6 days late.  Last sexual encounter 1 month ago.  Last menstrual period 01/18/2021, endorses that bleeding occurred 3 days longer than normal.  Endorses breast tenderness, lower abdominal cramping and nausea without vomiting.  Denies spotting.     Past Medical History:  Diagnosis Date   Adenopathy 06/11/2015   Allergy    Anxiety    Depression    Environmental and seasonal allergies    Recurrent sinus infections 06/11/2015    Patient Active Problem List   Diagnosis Date Noted   Arthralgia of right hip 02/14/2021   Gluteal tendinitis, left hip 02/14/2021   It band syndrome, left 02/14/2021   Chronic idiopathic constipation 02/14/2021   Vitamin D deficiency 02/14/2021   Annual physical exam 10/25/2020   Cervicogenic headache 10/25/2020   Fatigue 10/25/2020   Acne 10/25/2020   Encounter for supervision of normal first pregnancy in first trimester 05/18/2020   Pelvic and perineal pain 03/22/2020   Sleep difficulties 07/17/2015   Inattention 06/17/2015   Aphthae 06/11/2015   Headache disorder 06/11/2015   Nocturnal enuresis 06/11/2015   Allergic rhinitis, seasonal 06/11/2015   Migraine without aura and without status migrainosus, not intractable 01/05/2015   Episodic tension-type headache, not intractable 01/05/2015    Past Surgical History:  Procedure Laterality Date   INDUCED ABORTION     TONSILLECTOMY AND ADENOIDECTOMY     WISDOM TOOTH EXTRACTION      OB History     Gravida  2   Para  0   Term  0   Preterm  0   AB  1   Living         SAB  0   IAB  1   Ectopic  0   Multiple      Live Births                Home Medications    Prior to Admission medications   Medication Sig Start Date End Date Taking? Authorizing Provider  Clindamycin-Benzoyl Per, Refr, gel Apply 1 application topically daily. Patient not taking: Reported on 02/14/2021 11/12/20   [provider]  meloxicam (MOBIC) 7.5 MG tablet Take 1 tablet (7.5 mg total) by mouth daily. 02/14/21   Montel Culver, MD  polyethylene glycol powder (GLYCOLAX/MIRALAX) 17 GM/SCOOP powder Take 17 g by mouth 2 (two) times daily as needed. 02/14/21   Montel Culver, MD  RETIN-A 0.05 % cream Apply 1 application topically at bedtime. Patient not taking: Reported on 02/14/2021 11/11/20   [provider]  Vitamin D, Ergocalciferol, (DRISDOL) 1.25 MG (50000 UNIT) CAPS capsule Take 1 capsule (50,000 Units total) by mouth every 7 (seven) days. Take for 8 total doses(weeks) Patient not taking: Reported on 02/14/2021 10/27/20   Montel Culver, MD    Family History Family History  Problem Relation Age of Onset   Anxiety disorder Mother    Diabetes Mother    Hypertension Mother    Other Father 45       Drowned   Diabetes Maternal Grandmother    Skin cancer Maternal Grandmother    Kidney disease  Maternal Grandmother    Heart attack Maternal Grandfather 34   Breast cancer Paternal Grandmother    Healthy Half-Brother    Healthy Half-Sister     Social History Social History   Tobacco Use   Smoking status: Never   Smokeless tobacco: Never  Vaping Use   Vaping Use: Never used  Substance Use Topics   Alcohol use: Not Currently   Drug use: Not Currently    Types: Marijuana     Allergies   Patient has no known allergies.   Review of Systems Review of Systems  Genitourinary:  Positive for menstrual problem. Negative for decreased urine volume, difficulty urinating, dyspareunia, dysuria, enuresis, flank pain, frequency, genital sores, hematuria, pelvic pain, urgency, vaginal bleeding, vaginal discharge and vaginal pain.   Skin: Negative.   Neurological: Negative.     Physical Exam Triage Vital Signs ED Triage Vitals  Enc Vitals Group     BP 02/24/21 1709 125/68     Pulse Rate 02/24/21 1709 (!) 57     Resp 02/24/21 1709 16     Temp 02/24/21 1709 98.7 F (37.1 C)     Temp Source 02/24/21 1709 Oral     SpO2 02/24/21 1709 98 %     Weight --      Height --      Head Circumference --      Peak Flow --      Pain Score 02/24/21 1712 0     Pain Loc --      Pain Edu? --      Excl. in GC? --    No data found.  Updated Vital Signs BP 125/68 (BP Location: Right Arm)   Pulse (!) 57   Temp 98.7 F (37.1 C) (Oral)   Resp 16   LMP 01/18/2021   SpO2 98%   Visual Acuity Right Eye Distance:   Left Eye Distance:   Bilateral Distance:    Right Eye Near:   Left Eye Near:    Bilateral Near:     Physical Exam Constitutional:      Appearance: Normal appearance. She is normal weight.  HENT:     Head: Normocephalic.  Eyes:     Extraocular Movements: Extraocular movements intact.  Musculoskeletal:     Cervical back: Normal range of motion and neck supple.  Skin:    General: Skin is warm and dry.  Neurological:     Mental Status: She is alert and oriented to person, place, and time. Mental status is at baseline.  Psychiatric:        Mood and Affect: Mood normal.        Behavior: Behavior normal.     UC Treatments / Results  Labs (all labs ordered are listed, but only abnormal results are displayed) Labs Reviewed  HCG, QUANTITATIVE, PREGNANCY    EKG   Radiology No results found.  Procedures Procedures (including critical care time)  Medications Ordered in UC Medications - No data to display  Initial Impression / Assessment and Plan / UC Course  I have reviewed the triage vital signs and the nursing notes.  Pertinent labs & imaging results that were available during my care of the patient were reviewed by me and considered in my medical decision making (see chart for  details).  Missed menses  Etiology of symptoms may be related to irregularity of the last menstrual period, discussed with patient, advised that if hCG level negative to monitor for 1 month then retake pregnancy test  if no period occurs around expected timeframe, if testing still negative to follow-up with PCP or gynecologist for further evaluation  1.  hCG level pending Final Clinical Impressions(s) / UC Diagnoses   Final diagnoses:  None   Discharge Instructions   None    ED Prescriptions   None    PDMP not reviewed this encounter.   Hans Eden, NP 02/24/21 [phone]

## 2021-02-24 NOTE — ED Triage Notes (Addendum)
Pt presents today requesting to have blood drawn to determine if she is pregnant. She states that she has taken several urine tests and they have been negative. Pt declined urine pregnancy testing in triage.

## 2021-02-24 NOTE — Discharge Instructions (Addendum)
Your hCG level is pending, you will be notified of any positive results  It is still possible that your period will come on within the next 3 days due to an extended period last month  It is possible due to your extended period last month that your hormones are no longer regulated and it may take a while to reset  You can monitor and watchfully wait for the next month to see if you have a period in December around the expected time, if you do not take a pregnancy test, a dollar store test is efficient, if negative please follow-up with your primary care doctor or gynecologist for further evaluation

## 2021-03-01 DIAGNOSIS — F4320 Adjustment disorder, unspecified: Secondary | ICD-10-CM | POA: Diagnosis not present

## 2021-03-08 ENCOUNTER — Encounter: Payer: Self-pay | Admitting: Obstetrics & Gynecology

## 2021-03-08 ENCOUNTER — Other Ambulatory Visit (HOSPITAL_COMMUNITY)
Admission: RE | Admit: 2021-03-08 | Discharge: 2021-03-08 | Disposition: A | Discharge status: Home / Self Care | Payer: Medicaid Other | Source: Ambulatory Visit | Attending: Obstetrics & Gynecology | Admitting: Obstetrics & Gynecology

## 2021-03-08 ENCOUNTER — Ambulatory Visit (INDEPENDENT_AMBULATORY_CARE_PROVIDER_SITE_OTHER): Payer: Medicaid Other | Admitting: Obstetrics & Gynecology

## 2021-03-08 ENCOUNTER — Other Ambulatory Visit: Payer: Self-pay

## 2021-03-08 VITALS — BP 100/60 | Ht 64.0 in | Wt 111.0 lb

## 2021-03-08 DIAGNOSIS — N926 Irregular menstruation, unspecified: Secondary | ICD-10-CM

## 2021-03-08 DIAGNOSIS — Z113 Encounter for screening for infections with a predominantly sexual mode of transmission: Secondary | ICD-10-CM | POA: Insufficient documentation

## 2021-03-08 DIAGNOSIS — F4320 Adjustment disorder, unspecified: Secondary | ICD-10-CM | POA: Diagnosis not present

## 2021-03-08 LAB — POCT URINE PREGNANCY: Preg Test, Ur: NEGATIVE

## 2021-03-08 NOTE — Addendum Note (Signed)
Addended by: Cornelius Moras D on: 03/08/2021 03:24 PM   Modules accepted: Orders

## 2021-03-08 NOTE — Progress Notes (Signed)
Amenorrhea Patient presents for evaluation of a missed period which since scheduling has become a late period. Currently periods are occurring every 28 days. Bleeding is light. Recently missed period and had sx's of breast T, nausea, dizziness, crampy pelvic pains; had neg uCG and blood test (on11/17) for pregnancy.  Patient has no relevant history of abnormal sexual development.  She has low BMI.  Does not like hormones for birth control (has tried Nexplanon w BTB, and pacth and pill w poor tolerence or compliance). Is there a chance of pregnancy? yes. HCG lab test done? yes, negative. Factors that may be contributory to menstrual abnormalities include:  recent Covid 2 mos ago (known to affect periods) . Previous treatments for menstrual abnormalities include:  none .  Uses condoms and PlanB for contraception.  PMHx: She  has a past medical history of Adenopathy (06/11/2015), Allergy, Anxiety, Depression, Environmental and seasonal allergies, and Recurrent sinus infections (06/11/2015). Also,  has a past surgical history that includes Tonsillectomy and adenoidectomy; Wisdom tooth extraction; and Induced abortion., family history includes Anxiety disorder in her mother; Breast cancer in her paternal grandmother; Diabetes in her maternal grandmother and mother; Healthy in her half-brother and half-sister; Heart attack (age of onset: 48) in her maternal grandfather; Hypertension in her mother; Kidney disease in her maternal grandmother; Other (age of onset: 28) in her father; Skin cancer in her maternal grandmother.,  reports that she has never smoked. She has never used smokeless tobacco. She reports that she does not currently use alcohol. She reports that she does not currently use drugs after having used the following drugs: Marijuana.  She has a current medication list which includes the following prescription(s): clindamycin-benzoyl per (refr), retin-a, and vitamin d (ergocalciferol). Also, has No Known  Allergies.  Review of Systems  Constitutional:  Negative for chills, fever and malaise/fatigue.  HENT:  Negative for congestion, sinus pain and sore throat.   Eyes:  Negative for blurred vision and pain.  Respiratory:  Negative for cough and wheezing.   Cardiovascular:  Negative for chest pain and leg swelling.  Gastrointestinal:  Negative for abdominal pain, constipation, diarrhea, heartburn, nausea and vomiting.  Genitourinary:  Negative for dysuria, frequency, hematuria and urgency.  Musculoskeletal:  Negative for back pain, joint pain, myalgias and neck pain.  Skin:  Negative for itching and rash.  Neurological:  Negative for dizziness, tremors and weakness.  Endo/Heme/Allergies:  Does not bruise/bleed easily.  Psychiatric/Behavioral:  Negative for depression. The patient is not nervous/anxious and does not have insomnia.    Objective: BP 100/60   Ht 5\' 4"  (1.626 m)   Wt 111 lb (50.3 kg)   LMP 03/05/2021   BMI 19.05 kg/m  Physical Exam Constitutional:      General: She is not in acute distress.    Appearance: She is well-developed.  Musculoskeletal:        General: Normal range of motion.  Neurological:     Mental Status: She is alert and oriented to person, place, and time.  Skin:    General: Skin is warm and dry.  Vitals reviewed.    ASSESSMENT/PLAN:    Problem List Items Addressed This Visit     Late period    -  Primary   Relevant Orders   Beta hCG quant (ref lab)   Screen for STD (sexually transmitted disease)       Relevant Orders   Urine cytology ancillary only  Likely oligomenorrhea related to recent covid, low BMI, or other etiology  although none serious or emergent. Cont to monitor cycles Confirm neg beta hCG today    Annamarie Major, MD, [facility], [facility] 03/08/2021  3:14 PM

## 2021-03-09 LAB — BETA HCG QUANT (REF LAB): hCG Quant: <1 m[IU]/mL

## 2021-03-10 LAB — URINE CYTOLOGY ANCILLARY ONLY
Chlamydia: NEGATIVE
Comment: NEGATIVE
Comment: NORMAL
Neisseria Gonorrhea: NEGATIVE

## 2021-03-22 DIAGNOSIS — F4320 Adjustment disorder, unspecified: Secondary | ICD-10-CM | POA: Diagnosis not present

## 2021-03-28 ENCOUNTER — Ambulatory Visit: Payer: Medicaid Other | Admitting: Family Medicine

## 2021-03-28 DIAGNOSIS — F4320 Adjustment disorder, unspecified: Secondary | ICD-10-CM | POA: Diagnosis not present

## 2021-04-12 DIAGNOSIS — F4320 Adjustment disorder, unspecified: Secondary | ICD-10-CM | POA: Diagnosis not present

## 2021-04-19 DIAGNOSIS — F4320 Adjustment disorder, unspecified: Secondary | ICD-10-CM | POA: Diagnosis not present

## 2021-04-26 DIAGNOSIS — F4320 Adjustment disorder, unspecified: Secondary | ICD-10-CM | POA: Diagnosis not present

## 2021-05-03 DIAGNOSIS — F4320 Adjustment disorder, unspecified: Secondary | ICD-10-CM | POA: Diagnosis not present

## 2021-05-10 DIAGNOSIS — F4320 Adjustment disorder, unspecified: Secondary | ICD-10-CM | POA: Diagnosis not present

## 2021-05-24 DIAGNOSIS — F4320 Adjustment disorder, unspecified: Secondary | ICD-10-CM | POA: Diagnosis not present

## 2021-06-07 DIAGNOSIS — F4320 Adjustment disorder, unspecified: Secondary | ICD-10-CM | POA: Diagnosis not present

## 2021-06-30 ENCOUNTER — Ambulatory Visit
Admission: EM | Admit: 2021-06-30 | Discharge: 2021-06-30 | Disposition: A | Discharge status: Home / Self Care | Payer: Medicaid Other | Attending: Internal Medicine | Admitting: Internal Medicine

## 2021-06-30 ENCOUNTER — Other Ambulatory Visit: Payer: Self-pay

## 2021-06-30 DIAGNOSIS — A09 Infectious gastroenteritis and colitis, unspecified: Secondary | ICD-10-CM | POA: Diagnosis not present

## 2021-06-30 DIAGNOSIS — Z789 Other specified health status: Secondary | ICD-10-CM

## 2021-06-30 NOTE — ED Provider Notes (Signed)
?[place] ? ? ? ?CSN: [id number] ?Arrival date & time: 06/30/21  1356 ? ? ?  ? ?History   ?Chief Complaint ?Chief Complaint  ?Patient presents with  ? Abdominal Pain  ? ? ?HPI ?Pamela Foster is a 21 y.o. female who believes had posible food poisoning  while in [place] 2 weeks ago, and woke up with n/v/d and aches which lasted 3 days. Since then ever time she eats gets lower abdominal cramps, nausea and gets solid stool. Has had black, dark brown, green and cream color stools at any time. She took papto 3 days ago. Has mild aches this week, but no chills or shaking since when she first started the illness.  ?She ate greens, lentils, rice, butternut squash soup, bread, and bottled water the day before symptoms started. All this food was prepared at the hotel she stayed at.  ? ? ? ? ?Past Medical History:  ?Diagnosis Date  ? Adenopathy 06/11/2015  ? Allergy   ? Anxiety   ? Depression   ? Environmental and seasonal allergies   ? Recurrent sinus infections 06/11/2015  ? ? ?Patient Active Problem List  ? Diagnosis Date Noted  ? Arthralgia of right hip 02/14/2021  ? Gluteal tendinitis, left hip 02/14/2021  ? It band syndrome, left 02/14/2021  ? Chronic idiopathic constipation 02/14/2021  ? Vitamin D deficiency 02/14/2021  ? Annual physical exam 10/25/2020  ? Cervicogenic headache 10/25/2020  ? Fatigue 10/25/2020  ? Acne 10/25/2020  ? Encounter for supervision of normal first pregnancy in first trimester 05/18/2020  ? Pelvic and perineal pain 03/22/2020  ? Sleep difficulties 07/17/2015  ? Inattention 06/17/2015  ? Aphthae 06/11/2015  ? Headache disorder 06/11/2015  ? Nocturnal enuresis 06/11/2015  ? Allergic rhinitis, seasonal 06/11/2015  ? Migraine without aura and without status migrainosus, not intractable 01/05/2015  ? Episodic tension-type headache, not intractable 01/05/2015  ? ? ?Past Surgical History:  ?Procedure Laterality Date  ? INDUCED ABORTION    ? TONSILLECTOMY AND ADENOIDECTOMY    ? WISDOM TOOTH  EXTRACTION    ? ? ?OB History   ? ? Gravida  ?2  ? Para  ?0  ? Term  ?0  ? Preterm  ?0  ? AB  ?1  ? Living  ?   ?  ? ? SAB  ?0  ? IAB  ?1  ? Ectopic  ?0  ? Multiple  ?   ? Live Births  ?   ?   ?  ?  ? ? ? ?Home Medications   ? ?Prior to Admission medications   ?Not on File  ? ? ?Family History ?Family History  ?Problem Relation Age of Onset  ? Anxiety disorder Mother   ? Diabetes Mother   ? Hypertension Mother   ? Other Father 85  ?     Drowned  ? Diabetes Maternal Grandmother   ? Skin cancer Maternal Grandmother   ? Kidney disease Maternal Grandmother   ? Heart attack Maternal Grandfather 60  ? Breast cancer Paternal Grandmother   ? Healthy Half-Brother   ? Healthy Half-Sister   ? ? ?Social History ?Social History  ? ?Tobacco Use  ? Smoking status: Never  ? Smokeless tobacco: Never  ?Vaping Use  ? Vaping Use: Never used  ?Substance Use Topics  ? Alcohol use: Not Currently  ? Drug use: Not Currently  ?  Types: Marijuana  ? ? ? ?Allergies   ?Patient has no known allergies. ? ? ?Review  of Systems ?Review of Systems  ?Constitutional:  Positive for chills. Negative for fever.  ?Gastrointestinal:  Positive for abdominal pain, diarrhea and nausea. Negative for abdominal distention, blood in stool, constipation and vomiting.  ?Genitourinary:  Negative for dysuria, frequency and urgency.  ?Skin:  Negative for rash and wound.  ? ? ?Physical Exam ?Triage Vital Signs ?ED Triage Vitals  ?Enc Vitals Group  ?   BP 06/30/21 1416 112/74  ?   Pulse Rate 06/30/21 1416 67  ?   Resp 06/30/21 1416 18  ?   Temp 06/30/21 1416 98.9 ?F (37.2 ?C)  ?   Temp Source 06/30/21 1416 Oral  ?   SpO2 06/30/21 1416 97 %  ?   Weight 06/30/21 1415 110 lb (49.9 kg)  ?   Height 06/30/21 1415 5\' 4"  (1.626 m)  ?   Head Circumference --   ?   Peak Flow --   ?   Pain Score 06/30/21 1414 6  ?   Pain Loc --   ?   Pain Edu? --   ?   Excl. in [place]? --   ? ?No data found. ? ?Updated Vital Signs ?BP 112/74 (BP Location: Left Arm)   Pulse 67   Temp 98.9 ?F (37.2 ?C)  (Oral)   Resp 18   Ht 5\' 4"  (1.626 m)   Wt 108 lb 3.2 oz (49.1 kg)   LMP 06/18/2021   SpO2 97%   BMI 18.57 kg/m?  ? ?Visual Acuity ?Right Eye Distance:   ?Left Eye Distance:   ?Bilateral Distance:   ? ?Right Eye Near:   ?Left Eye Near:    ?Bilateral Near:    ? ?Physical Exam ?Vitals and nursing note reviewed.  ?Constitutional:   ?   General: She is not in acute distress. ?   Appearance: She is not toxic-appearing.  ?HENT:  ?   Head: Normocephalic.  ?   Right Ear: External ear normal.  ?   Left Ear: External ear normal.  ?Eyes:  ?   General: No scleral icterus. ?   Conjunctiva/sclera: Conjunctivae normal.  ?Pulmonary:  ?   Effort: Pulmonary effort is normal.  ?Abdominal:  ?   General: Abdomen is flat. Bowel sounds are normal.  ?   Palpations: Abdomen is soft.  ?   Tenderness: There is abdominal tenderness in the left lower quadrant. There is no guarding or rebound.  ?   Comments: LLL   ?Musculoskeletal:     ?   General: Normal range of motion.  ?   Cervical back: Neck supple.  ?Skin: ?   General: Skin is warm and dry.  ?Neurological:  ?   Mental Status: She is alert and oriented to person, place, and time.  ?   Gait: Gait normal.  ?Psychiatric:     ?   Mood and Affect: Mood normal.     ?   Behavior: Behavior normal.     ?   Thought Content: Thought content normal.     ?   Judgment: Judgment normal.  ? ? ? ?UC Treatments / Results  ?Labs ?(all labs ordered are listed, but only abnormal results are displayed) ?Labs Reviewed - No data to display ? ?EKG ? ? ?Radiology ?No results found. ? ?Procedures ?Procedures (including critical care time) ? ?Medications Ordered in UC ?Medications - No data to display ? ?Initial Impression / Assessment and Plan / UC Course  ?I have reviewed the triage vital signs and the nursing notes. ?  Possibly infectious bowel changes ?I ordered stool GI panel and O&P.  ?We will call her if the results are positive.  ?See instructions.  ?Clinical Course as of 06/30/21 1540  ?Thu Jun 30, 2021   ?1443 BP: 112/74 [SR]  ?  ?Clinical Course User Index ?[SR] Rodriguez-Southworth, Sunday Spillers, PA-C  ? ? ? ?Final Clinical Impressions(s) / UC Diagnoses  ? ?Final diagnoses:  ?Diarrhea of infectious origin  ?History of foreign travel  ? ? ? ?Discharge Instructions   ? ?  ?Collect the samples and take bring them back here. Our weekend hours are 8a-4p ?We will call you if the results are positive.  ? ? ? ? ?ED Prescriptions   ?None ?  ? ?PDMP not reviewed this encounter. ?  ?Shelby Mattocks, PA-C ?06/30/21 1540 ? ?

## 2021-06-30 NOTE — ED Triage Notes (Signed)
Pt went to [place] and had food poisoning 2 weeks ago. Pt states that she still has some issues with her stomach and was concerned. ? ?Pt c/o abdominal cramps after eating. Pt last bowel movement was an hour ago, Pt states that her bowel movements are directly after her food and comes out multiple colors. Pt states that some of her stool came out [place].  ?

## 2021-06-30 NOTE — Discharge Instructions (Signed)
Collect the samples and take bring them back here. Our weekend hours are 8a-4p ?We will call you if the results are positive.  ?

## 2021-07-01 ENCOUNTER — Ambulatory Visit
Admission: RE | Admit: 2021-07-01 | Discharge: 2021-07-01 | Disposition: A | Discharge status: Home / Self Care | Payer: Medicaid Other | Source: Ambulatory Visit | Attending: Internal Medicine | Admitting: Internal Medicine

## 2021-07-01 ENCOUNTER — Telehealth: Payer: Self-pay | Admitting: Internal Medicine

## 2021-07-01 DIAGNOSIS — R197 Diarrhea, unspecified: Secondary | ICD-10-CM | POA: Diagnosis present

## 2021-07-01 DIAGNOSIS — A09 Infectious gastroenteritis and colitis, unspecified: Secondary | ICD-10-CM | POA: Diagnosis not present

## 2021-07-01 LAB — GASTROINTESTINAL PANEL BY PCR, STOOL (REPLACES STOOL CULTURE)
Adenovirus F40/41: NOT DETECTED
Astrovirus: NOT DETECTED
Campylobacter species: NOT DETECTED
Cryptosporidium: NOT DETECTED
Cyclospora cayetanensis: NOT DETECTED
E. coli O157: NOT DETECTED
Entamoeba histolytica: NOT DETECTED
Enteroaggregative E coli (EAEC): DETECTED — AB
Enterotoxigenic E coli (ETEC): DETECTED — AB
Giardia lamblia: NOT DETECTED
Norovirus GI/GII: DETECTED — AB
Plesimonas shigelloides: NOT DETECTED
Rotavirus A: NOT DETECTED
Salmonella species: NOT DETECTED
Sapovirus (I, II, IV, and V): NOT DETECTED
Shiga like toxin producing E coli (STEC): DETECTED — AB
Shigella/Enteroinvasive E coli (EIEC): DETECTED — AB
Vibrio cholerae: NOT DETECTED
Vibrio species: NOT DETECTED
Yersinia enterocolitica: NOT DETECTED

## 2021-07-01 MED ORDER — CIPROFLOXACIN HCL 500 MG PO TABS: 500.0000 mg | ORAL_TABLET | Freq: Two times a day (BID) | ORAL | 0 refills | Status: DC

## 2021-07-01 NOTE — Telephone Encounter (Signed)
I called pt to informed her about her GI panel results. I sent Cipro to her pharmacy to start today.  ?

## 2021-07-05 DIAGNOSIS — F4320 Adjustment disorder, unspecified: Secondary | ICD-10-CM | POA: Diagnosis not present

## 2021-07-11 ENCOUNTER — Encounter: Payer: Self-pay | Admitting: Family Medicine

## 2021-07-11 NOTE — Telephone Encounter (Signed)
Please advise 

## 2021-07-11 NOTE — Telephone Encounter (Signed)
FYI

## 2021-07-19 DIAGNOSIS — F4320 Adjustment disorder, unspecified: Secondary | ICD-10-CM | POA: Diagnosis not present

## 2021-08-02 DIAGNOSIS — F4320 Adjustment disorder, unspecified: Secondary | ICD-10-CM | POA: Diagnosis not present

## 2021-08-09 DIAGNOSIS — F4320 Adjustment disorder, unspecified: Secondary | ICD-10-CM | POA: Diagnosis not present

## 2021-08-16 DIAGNOSIS — F4320 Adjustment disorder, unspecified: Secondary | ICD-10-CM | POA: Diagnosis not present

## 2021-08-30 DIAGNOSIS — F4320 Adjustment disorder, unspecified: Secondary | ICD-10-CM | POA: Diagnosis not present

## 2021-09-19 DIAGNOSIS — F4320 Adjustment disorder, unspecified: Secondary | ICD-10-CM | POA: Diagnosis not present

## 2021-09-20 ENCOUNTER — Ambulatory Visit
Admission: EM | Admit: 2021-09-20 | Discharge: 2021-09-20 | Disposition: A | Discharge status: Home / Self Care | Payer: Medicaid Other | Attending: Emergency Medicine | Admitting: Emergency Medicine

## 2021-09-20 ENCOUNTER — Encounter: Payer: Self-pay | Admitting: Emergency Medicine

## 2021-09-20 DIAGNOSIS — B3731 Acute candidiasis of vulva and vagina: Secondary | ICD-10-CM

## 2021-09-20 LAB — URINALYSIS, ROUTINE W REFLEX MICROSCOPIC
Bilirubin Urine: NEGATIVE
Glucose, UA: NEGATIVE mg/dL
Ketones, ur: NEGATIVE mg/dL
Nitrite: NEGATIVE
Protein, ur: NEGATIVE mg/dL
Specific Gravity, Urine: >1.03 — ABNORMAL HIGH (ref 1.005–1.030)
pH: 5.5 (ref 5.0–8.0)

## 2021-09-20 LAB — WET PREP, GENITAL
Clue Cells Wet Prep HPF POC: NONE SEEN
Sperm: NONE SEEN
Trich, Wet Prep: NONE SEEN
WBC, Wet Prep HPF POC: >10 — AB (ref <10)

## 2021-09-20 LAB — URINALYSIS, MICROSCOPIC (REFLEX)

## 2021-09-20 MED ORDER — FLUCONAZOLE 200 MG PO TABS: 200.0000 mg | ORAL_TABLET | Freq: Every day | ORAL | 1 refills | Status: AC

## 2021-09-20 NOTE — ED Provider Notes (Signed)
[facility]    CSN: [id number] Arrival date & time: 09/20/21  1041      History   Chief Complaint Chief Complaint  Patient presents with   Urinary Tract Infection    HPI Pamela Foster is a 21 y.o. female.   HPI  [age] old female here for evaluation of urinary complaints.  Patient reports that for the last 2 days she has been experiencing burning with urination along with urgency and frequency of urination.  She states that she feels like she has to void and then when she goes it is only for small amount.  She is also endorsing some mild suprapubic pain.  She denies any fever, low back pain, nausea, or vomiting.  When asked about vaginal discharge or itching she just describes it as being uncomfortable but does not endorse either symptom.  I did question her as to whether not she is concerned about STIs and she said it is a possibility.  She is monogamous with a single partner but they are having unprotected sex and she wants to be checked for gonorrhea and chlamydia.  I asked about HIV and syphilis and she states it is not necessary to test for other 1 of those.  Past Medical History:  Diagnosis Date   Adenopathy 06/11/2015   Allergy    Anxiety    Depression    Environmental and seasonal allergies    Recurrent sinus infections 06/11/2015    Patient Active Problem List   Diagnosis Date Noted   Arthralgia of right hip 02/14/2021   Gluteal tendinitis, left hip 02/14/2021   It band syndrome, left 02/14/2021   Chronic idiopathic constipation 02/14/2021   Vitamin D deficiency 02/14/2021   Annual physical exam 10/25/2020   Cervicogenic headache 10/25/2020   Fatigue 10/25/2020   Acne 10/25/2020   Encounter for supervision of normal first pregnancy in first trimester 05/18/2020   Pelvic and perineal pain 03/22/2020   Sleep difficulties 07/17/2015   Inattention 06/17/2015   Aphthae 06/11/2015   Headache disorder 06/11/2015   Nocturnal enuresis 06/11/2015   Allergic  rhinitis, seasonal 06/11/2015   Migraine without aura and without status migrainosus, not intractable 01/05/2015   Episodic tension-type headache, not intractable 01/05/2015    Past Surgical History:  Procedure Laterality Date   INDUCED ABORTION     TONSILLECTOMY AND ADENOIDECTOMY     WISDOM TOOTH EXTRACTION      OB History     Gravida  2   Para  0   Term  0   Preterm  0   AB  1   Living         SAB  0   IAB  1   Ectopic  0   Multiple      Live Births               Home Medications    Prior to Admission medications   Medication Sig Start Date End Date Taking? Authorizing Provider  fluconazole (DIFLUCAN) 200 MG tablet Take 1 tablet (200 mg total) by mouth daily for 2 doses. 09/20/21 09/22/21 Yes Becky Augustayan, Diane Mochizuki, NP  ciprofloxacin (CIPRO) 500 MG tablet Take 1 tablet (500 mg total) by mouth 2 (two) times daily. 07/01/21   Rodriguez-Southworth, Nettie ElmSylvia, PA-C    Family History Family History  Problem Relation Age of Onset   Anxiety disorder Mother    Diabetes Mother    Hypertension Mother    Other Father [id number]  Drowned   Diabetes Maternal Grandmother    Skin cancer Maternal Grandmother    Kidney disease Maternal Grandmother    Heart attack Maternal Grandfather 45   Breast cancer Paternal Grandmother    Healthy Half-Brother    Healthy Half-Sister     Social History Social History   Tobacco Use   Smoking status: Never   Smokeless tobacco: Never  Vaping Use   Vaping Use: Never used  Substance Use Topics   Alcohol use: Not Currently   Drug use: Not Currently    Types: Marijuana     Allergies   Patient has no known allergies.   Review of Systems Review of Systems  Constitutional:  Negative for fever.  Gastrointestinal:  Positive for abdominal pain. Negative for nausea and vomiting.  Genitourinary:  Positive for dysuria, frequency, urgency and vaginal pain. Negative for hematuria and vaginal discharge.  Musculoskeletal:  Negative for back  pain.  Hematological: Negative.   Psychiatric/Behavioral: Negative.       Physical Exam Triage Vital Signs ED Triage Vitals  Enc Vitals Group     BP 09/20/21 1112 111/61     Pulse Rate 09/20/21 1112 (!) 56     Resp 09/20/21 1112 18     Temp 09/20/21 1113 98.6 F (37 C)     Temp Source 09/20/21 1113 Oral     SpO2 09/20/21 1112 100 %     Weight --      Height --      Head Circumference --      Peak Flow --      Pain Score 09/20/21 1111 0     Pain Loc --      Pain Edu? --      Excl. in [place]? --    No data found.  Updated Vital Signs BP 111/61 (BP Location: Left Arm)   Pulse (!) 56   Temp 98.6 F (37 C) (Oral)   Resp 18   SpO2 100%   Visual Acuity Right Eye Distance:   Left Eye Distance:   Bilateral Distance:    Right Eye Near:   Left Eye Near:    Bilateral Near:     Physical Exam Vitals and nursing note reviewed.  Constitutional:      Appearance: Normal appearance.  HENT:     Head: Normocephalic and atraumatic.  Cardiovascular:     Rate and Rhythm: Normal rate and regular rhythm.     Pulses: Normal pulses.     Heart sounds: Normal heart sounds. No murmur heard.    No friction rub. No gallop.  Pulmonary:     Effort: Pulmonary effort is normal.     Breath sounds: Normal breath sounds. No wheezing, rhonchi or rales.  Abdominal:     General: Abdomen is flat. Bowel sounds are normal.     Palpations: Abdomen is soft.     Tenderness: There is abdominal tenderness. There is no right CVA tenderness, left CVA tenderness, guarding or rebound.  Skin:    General: Skin is warm and dry.     Capillary Refill: Capillary refill takes less than 2 seconds.     Findings: No erythema or rash.  Neurological:     General: No focal deficit present.     Mental Status: She is alert and oriented to person, place, and time.  Psychiatric:        Mood and Affect: Mood normal.        Behavior: Behavior normal.  Thought Content: Thought content normal.        Judgment:  Judgment normal.      UC Treatments / Results  Labs (all labs ordered are listed, but only abnormal results are displayed) Labs Reviewed  WET PREP, GENITAL - Abnormal; Notable for the following components:      Result Value   Yeast Wet Prep HPF POC PRESENT (*)    WBC, Wet Prep HPF POC >10 (*)    All other components within normal limits  URINALYSIS, ROUTINE W REFLEX MICROSCOPIC - Abnormal; Notable for the following components:   Specific Gravity, Urine >1.030 (*)    Hgb urine dipstick TRACE (*)    Leukocytes,Ua TRACE (*)    All other components within normal limits  URINALYSIS, MICROSCOPIC (REFLEX) - Abnormal; Notable for the following components:   Bacteria, UA FEW (*)    All other components within normal limits  CERVICOVAGINAL ANCILLARY ONLY    EKG   Radiology No results found.  Procedures Procedures (including critical care time)  Medications Ordered in UC Medications - No data to display  Initial Impression / Assessment and Plan / UC Course  I have reviewed the triage vital signs and the nursing notes.  Pertinent labs & imaging results that were available during my care of the patient were reviewed by me and considered in my medical decision making (see chart for details).  Patient is a very pleasant, nontoxic-appearing 21 year old female here for evaluation of dysuria as outlined in HPI above.  Her physical exam reveals a benign cardiopulmonary exam with S1-S2 heart sounds with regular rate and rhythm and lung sounds that are clear to auscultation all fields.  No CVA tenderness on exam.  Abdomen soft and flat with mild suprapubic tenderness.  No guarding or rebound.  Urinalysis was collected at triage.  She is requesting gonorrhea chlamydia testing.  I will also order a wet prep to look for trichomonas, yeast, or BV that may be adding to her urinary symptoms.  Urinalysis shows trace hemoglobin and trace leukocyte esterase.  It is negative for nitrites or protein.   Specific gravity is high at >1.030.  Reflex microscopy shows few bacteria but budding yeast is present.  No RBCs, WBCs, or squamous epithelials present.  Vaginal wet prep shows the presence of yeast but no trichomonas or clue cells.  Gonorrhea & chlamydia testing is pending  I will treat patient for vaginal yeast infection with Diflucan 2 mg now and repeat in 1 week as needed.   Final Clinical Impressions(s) / UC Diagnoses   Final diagnoses:  Vaginal yeast infection     Discharge Instructions      Your urinalysis today did not demonstrate the presence of infection but your vaginal swab did show that you have a yeast infection.  I believe this is what is causing your urinary symptoms.  Take the Diflucan 20 mg tablet now and repeat in 1 week if you are still having symptoms.  You can use over-the-counter Azo Standard according to the package instructions as needed for urinary discomfort.  This will turn your urine a bright red-orange but it acts as a bladder anesthetic so it should help with the burning, urgency, and frequency.  If you develop any new or worsening symptoms please return for reevaluation.  Your STI testing will be back tomorrow as we discussed and you will be contacted if any of your test results are positive.     ED Prescriptions     Medication Sig  Dispense Auth. Provider   fluconazole (DIFLUCAN) 200 MG tablet Take 1 tablet (200 mg total) by mouth daily for 2 doses. 2 tablet Becky Augusta, NP      PDMP not reviewed this encounter.   Becky Augusta, NP 09/20/21 1148

## 2021-09-20 NOTE — ED Triage Notes (Signed)
C/o dysuria x 2 days.  

## 2021-09-20 NOTE — Discharge Instructions (Addendum)
Your urinalysis today did not demonstrate the presence of infection but your vaginal swab did show that you have a yeast infection.  I believe this is what is causing your urinary symptoms.  Take the Diflucan 20 mg tablet now and repeat in 1 week if you are still having symptoms.  You can use over-the-counter Azo Standard according to the package instructions as needed for urinary discomfort.  This will turn your urine a bright red-orange but it acts as a bladder anesthetic so it should help with the burning, urgency, and frequency.  If you develop any new or worsening symptoms please return for reevaluation.  Your STI testing will be back tomorrow as we discussed and you will be contacted if any of your test results are positive.

## 2021-09-21 LAB — CERVICOVAGINAL ANCILLARY ONLY
Chlamydia: NEGATIVE
Comment: NEGATIVE
Comment: NORMAL
Neisseria Gonorrhea: NEGATIVE

## 2021-09-27 DIAGNOSIS — F4320 Adjustment disorder, unspecified: Secondary | ICD-10-CM | POA: Diagnosis not present

## 2021-10-04 DIAGNOSIS — F4320 Adjustment disorder, unspecified: Secondary | ICD-10-CM | POA: Diagnosis not present

## 2021-10-18 DIAGNOSIS — F4320 Adjustment disorder, unspecified: Secondary | ICD-10-CM | POA: Diagnosis not present

## 2021-11-01 DIAGNOSIS — F4320 Adjustment disorder, unspecified: Secondary | ICD-10-CM | POA: Diagnosis not present

## 2021-11-15 DIAGNOSIS — F4320 Adjustment disorder, unspecified: Secondary | ICD-10-CM | POA: Diagnosis not present

## 2021-11-29 DIAGNOSIS — F4320 Adjustment disorder, unspecified: Secondary | ICD-10-CM | POA: Diagnosis not present

## 2021-12-13 DIAGNOSIS — F4320 Adjustment disorder, unspecified: Secondary | ICD-10-CM | POA: Diagnosis not present

## 2021-12-27 DIAGNOSIS — F4320 Adjustment disorder, unspecified: Secondary | ICD-10-CM | POA: Diagnosis not present

## 2022-01-17 DIAGNOSIS — F4320 Adjustment disorder, unspecified: Secondary | ICD-10-CM | POA: Diagnosis not present

## 2022-02-07 DIAGNOSIS — F4320 Adjustment disorder, unspecified: Secondary | ICD-10-CM | POA: Diagnosis not present

## 2022-02-21 DIAGNOSIS — F4320 Adjustment disorder, unspecified: Secondary | ICD-10-CM | POA: Diagnosis not present

## 2022-03-14 DIAGNOSIS — F4320 Adjustment disorder, unspecified: Secondary | ICD-10-CM | POA: Diagnosis not present

## 2022-03-21 DIAGNOSIS — F4320 Adjustment disorder, unspecified: Secondary | ICD-10-CM | POA: Diagnosis not present

## 2022-04-11 DIAGNOSIS — F4320 Adjustment disorder, unspecified: Secondary | ICD-10-CM | POA: Diagnosis not present

## 2022-04-20 DIAGNOSIS — F4320 Adjustment disorder, unspecified: Secondary | ICD-10-CM | POA: Diagnosis not present

## 2022-04-25 DIAGNOSIS — F411 Generalized anxiety disorder: Secondary | ICD-10-CM | POA: Diagnosis not present

## 2022-05-02 DIAGNOSIS — F411 Generalized anxiety disorder: Secondary | ICD-10-CM | POA: Diagnosis not present

## 2022-05-17 DIAGNOSIS — F411 Generalized anxiety disorder: Secondary | ICD-10-CM | POA: Diagnosis not present

## 2022-05-22 ENCOUNTER — Ambulatory Visit: Payer: Medicaid Other | Admitting: Advanced Practice Midwife

## 2022-05-22 ENCOUNTER — Encounter: Payer: Self-pay | Admitting: Advanced Practice Midwife

## 2022-05-22 VITALS — BP 100/54 | HR 59 | Ht 64.0 in | Wt 117.6 lb

## 2022-05-22 DIAGNOSIS — Z3009 Encounter for other general counseling and advice on contraception: Secondary | ICD-10-CM | POA: Diagnosis not present

## 2022-05-22 NOTE — Progress Notes (Signed)
Patient ID: Pamela Foster, female   DOB: 11-04-2000, 22 y.o.   MRN: [id number]  Reason for Visit: Contraception   Subjective:  HPI:  Pamela Foster is a 22 y.o. female being seen for birth control consult. She has used various forms of hormonal birth control in the past and does not like the side effects. She prefers non-hormonal option. We discussed non-hormonal options including fertility awareness, condoms, phexxi, copper IUD. She is most interested in the IUD. Reviewed risks/benefits, potential side effects. She prefers to call when her next period starts for insertion. All of her questions are answered. She has a monthly period lasting 5-6 days. She has been using the "pull out" method. She is due for a PAP smear as she is now 63.  Past Medical History:  Diagnosis Date   Adenopathy 06/11/2015   Allergy    Anxiety    Depression    Environmental and seasonal allergies    Recurrent sinus infections 06/11/2015   Family History  Problem Relation Age of Onset   Anxiety disorder Mother    Diabetes Mother    Hypertension Mother    Other Father 69       Drowned   Diabetes Maternal Grandmother    Skin cancer Maternal Grandmother    Kidney disease Maternal Grandmother    Heart attack Maternal Grandfather 2   Breast cancer Paternal Grandmother    Healthy Half-Brother    Healthy Half-Sister    Past Surgical History:  Procedure Laterality Date   INDUCED ABORTION     TONSILLECTOMY AND ADENOIDECTOMY     WISDOM TOOTH EXTRACTION      Short Social History:  Social History   Tobacco Use   Smoking status: Never   Smokeless tobacco: Never  Substance Use Topics   Alcohol use: Not Currently    No Known Allergies  No current outpatient medications on file.   No current facility-administered medications for this visit.    Review of Systems  Constitutional:  Negative for chills and fever.  HENT:  Negative for congestion, ear discharge, ear pain, hearing loss, sinus pain and sore  throat.   Eyes:  Negative for blurred vision and double vision.  Respiratory:  Negative for cough, shortness of breath and wheezing.   Cardiovascular:  Negative for chest pain, palpitations and leg swelling.  Gastrointestinal:  Negative for abdominal pain, blood in stool, constipation, diarrhea, heartburn, melena, nausea and vomiting.  Genitourinary:  Negative for dysuria, flank pain, frequency, hematuria and urgency.  Musculoskeletal:  Negative for back pain, joint pain and myalgias.  Skin:  Negative for itching and rash.  Neurological:  Negative for dizziness, tingling, tremors, sensory change, speech change, focal weakness, seizures, loss of consciousness, weakness and headaches.  Endo/Heme/Allergies:  Negative for environmental allergies. Does not bruise/bleed easily.  Psychiatric/Behavioral:  Negative for depression, hallucinations, memory loss, substance abuse and suicidal ideas. The patient is not nervous/anxious and does not have insomnia.         Objective:  Objective   Vitals:   05/22/22 1054  BP: (!) 100/54  Pulse: (!) 59  Weight: 117 lb 9.6 oz (53.3 kg)  Height: 5' 4"$  (1.626 m)   Body mass index is 20.19 kg/m. Constitutional: Well nourished, well developed female in no acute distress.  HEENT: normal Skin: Warm and dry.  Cardiovascular: Regular rate and rhythm.   Extremity:  no edema   Respiratory: Clear to auscultation bilateral. Normal respiratory effort Psych: Alert and Oriented x3. No memory deficits.  Normal mood and affect.    Assessment/Plan:     22 y.o. desiring copper IUD for birth control  Call office when next period starts for insertion appointment   [facility] 05/22/2022, 1:23 PM

## 2022-05-22 NOTE — Patient Instructions (Signed)

## 2022-05-28 ENCOUNTER — Encounter: Payer: Self-pay | Admitting: Advanced Practice Midwife

## 2022-05-30 DIAGNOSIS — F411 Generalized anxiety disorder: Secondary | ICD-10-CM | POA: Diagnosis not present

## 2022-06-06 DIAGNOSIS — F411 Generalized anxiety disorder: Secondary | ICD-10-CM | POA: Diagnosis not present

## 2022-06-14 DIAGNOSIS — F411 Generalized anxiety disorder: Secondary | ICD-10-CM | POA: Diagnosis not present

## 2022-06-15 ENCOUNTER — Ambulatory Visit: Payer: Medicaid Other | Admitting: Obstetrics and Gynecology

## 2022-06-22 ENCOUNTER — Telehealth: Payer: Self-pay | Admitting: Family Medicine

## 2022-06-22 NOTE — Telephone Encounter (Signed)
Appt made

## 2022-06-22 NOTE — Telephone Encounter (Signed)
Referral Request - Has patient seen PCP for this complaint? no *If NO, is insurance requiring patient see PCP for this issue before PCP can refer them? Referral for which specialty: dermatologist Preferred provider/office: ? Reason for referral:  itchy dry scalp

## 2022-06-28 DIAGNOSIS — F411 Generalized anxiety disorder: Secondary | ICD-10-CM | POA: Diagnosis not present

## 2022-07-04 ENCOUNTER — Ambulatory Visit: Payer: Medicaid Other | Admitting: Family Medicine

## 2022-07-04 VITALS — BP 100/78 | HR 76 | Ht 64.0 in | Wt 120.0 lb

## 2022-07-04 DIAGNOSIS — F411 Generalized anxiety disorder: Secondary | ICD-10-CM | POA: Diagnosis not present

## 2022-07-04 DIAGNOSIS — K5904 Chronic idiopathic constipation: Secondary | ICD-10-CM | POA: Diagnosis not present

## 2022-07-04 DIAGNOSIS — L989 Disorder of the skin and subcutaneous tissue, unspecified: Secondary | ICD-10-CM

## 2022-07-04 MED ORDER — KETOCONAZOLE 2 % EX SHAM: MEDICATED_SHAMPOO | CUTANEOUS | 11 refills | Status: DC

## 2022-07-04 NOTE — Patient Instructions (Signed)
-   Start senna-docusate twice daily - Start Miralax daily and adjust every 2-3 days - Start probiotic daily - For no change in BM x 1 week, use enema - Use Rx shampoo - Referral coordinator will contact about dermatology scheduling - Return in 6 weeks for physical

## 2022-07-11 DIAGNOSIS — F411 Generalized anxiety disorder: Secondary | ICD-10-CM | POA: Diagnosis not present

## 2022-07-11 DIAGNOSIS — L989 Disorder of the skin and subcutaneous tissue, unspecified: Secondary | ICD-10-CM | POA: Insufficient documentation

## 2022-07-11 DIAGNOSIS — F4320 Adjustment disorder, unspecified: Secondary | ICD-10-CM | POA: Diagnosis not present

## 2022-07-11 NOTE — Assessment & Plan Note (Addendum)
Ongoing x 6 months, pruritus, flaking, denies any changes in products (shampoos, conditioners, detergents, etc.) and no new exposures.  Has tried different shampoos with no change.  Examination with dry scalp appearance, skin flaking, no erythema, no skin breakdown, nonpsoriatic in appearance.  Plan as follows: - ketoconazole regimen - follow-up with dermatology for recalcitrant symptoms

## 2022-07-11 NOTE — Assessment & Plan Note (Signed)
Chronic condition, since childhood, addressed last during the 02/2021 timeframe.  Was doing well with previous regimen, does maintain daily water intake.  Abdominal exam nonfocal.  Plan as follows: - Start senna-docusate twice daily - Start Miralax daily and adjust every 2-3 days - Start probiotic daily - For no change in BM x 1 week, use enema - Persistent symptoms to be addressed with consideration of Rx management

## 2022-07-16 NOTE — Progress Notes (Signed)
     Primary Care / Sports Medicine Office Visit  Patient Information:  Patient ID: Pamela Foster, female DOB: 01-26-2001 Age: 22 y.o. MRN: [id number]   Pamela Foster is a pleasant 22 y.o. female presenting with the following:  Chief Complaint  Patient presents with   Hair/Scalp Problem    6 months itching flake scalp    Vitals:   07/04/22 1532  BP: 100/78  Pulse: 76  SpO2: 98%   Vitals:   07/04/22 1532  Weight: 120 lb (54.4 kg)  Height: 5\' 4"  (1.626 m)   Body mass index is 20.6 kg/m.  No results found.   Independent interpretation of notes and tests performed by another provider:   None  Procedures performed:   None  Pertinent History, Exam, Impression, and Recommendations:   Pamela Foster was seen today for hair/scalp problem.  Dermatosis of scalp Assessment & Plan: Ongoing x 6 months, pruritus, flaking, denies any changes in products (shampoos, conditioners, detergents, etc.) and no new exposures.  Has tried different shampoos with no change.  Examination with dry scalp appearance, skin flaking, no erythema, no skin breakdown, nonpsoriatic in appearance.  Plan as follows: - ketoconazole regimen - follow-up with dermatology for recalcitrant symptoms   Orders: -     Ambulatory referral to Dermatology  Chronic idiopathic constipation Assessment & Plan: Chronic condition, since childhood, addressed last during the 02/2021 timeframe.  Was doing well with previous regimen, does maintain daily water intake.  Abdominal exam nonfocal.  Plan as follows: - Start senna-docusate twice daily - Start Miralax daily and adjust every 2-3 days - Start probiotic daily - For no change in BM x 1 week, use enema - Persistent symptoms to be addressed with consideration of Rx management   Other orders -     Ketoconazole; Apply to scalp twice a week for 8 weeks and then weekly thereafter  Dispense: 120 mL; Refill: 11     Orders & Medications Meds ordered this encounter   Medications   ketoconazole (NIZORAL) 2 % shampoo    Sig: Apply to scalp twice a week for 8 weeks and then weekly thereafter    Dispense:  120 mL    Refill:  11   Orders Placed This Encounter  Procedures   Ambulatory referral to Dermatology     Return in about 6 weeks (around 08/15/2022).     [name], MD, [facility]   Primary Care Sports Medicine Primary Care and Sports Medicine at [facility]

## 2022-07-19 ENCOUNTER — Telehealth: Payer: Self-pay

## 2022-07-19 NOTE — Telephone Encounter (Signed)
Pt was last seen by JEG and advised to call our office when she starts her period for IUD insertion. Pt has started her period. Pls call her to schedule IUD insertion.

## 2022-07-20 ENCOUNTER — Encounter: Payer: Self-pay | Admitting: Obstetrics and Gynecology

## 2022-07-20 ENCOUNTER — Other Ambulatory Visit (HOSPITAL_COMMUNITY)
Admission: RE | Admit: 2022-07-20 | Discharge: 2022-07-20 | Disposition: A | Discharge status: Home / Self Care | Payer: Medicaid Other | Source: Ambulatory Visit | Attending: Obstetrics and Gynecology | Admitting: Obstetrics and Gynecology

## 2022-07-20 ENCOUNTER — Ambulatory Visit: Payer: Medicaid Other | Admitting: Obstetrics and Gynecology

## 2022-07-20 VITALS — BP 104/70 | Ht 64.0 in | Wt 121.0 lb

## 2022-07-20 DIAGNOSIS — Z113 Encounter for screening for infections with a predominantly sexual mode of transmission: Secondary | ICD-10-CM

## 2022-07-20 DIAGNOSIS — Z30430 Encounter for insertion of intrauterine contraceptive device: Secondary | ICD-10-CM | POA: Diagnosis not present

## 2022-07-20 DIAGNOSIS — Z124 Encounter for screening for malignant neoplasm of cervix: Secondary | ICD-10-CM | POA: Diagnosis not present

## 2022-07-20 MED ORDER — PARAGARD INTRAUTERINE COPPER IU IUD
1.0000 | INTRAUTERINE_SYSTEM | Freq: Once | INTRAUTERINE | Status: AC
  Administered 2022-07-20: 1 via INTRAUTERINE

## 2022-07-20 NOTE — Telephone Encounter (Signed)
The patient is scheduled for 4/11 at 11:15 am with Helmut Muster Copland.

## 2022-07-20 NOTE — Patient Instructions (Addendum)
I value your feedback and you entrusting us with your care. If you get a [employer] patient survey, I would appreciate you taking the time to let us know about your experience today. Thank you!  Instructions after IUD insertion  Most women experience no significant problems after insertion of an IUD, however minor cramping and spotting for a few days is common. Cramps may be treated with ibuprofen 800mg every 8 hours or Tylenol 650 mg every 4 hours. Contact [facility] immediately if you experience any of the following symptoms during the next week: temperature >99.6 degrees, worsening pelvic pain, abdominal pain, fainting, unusually heavy vaginal bleeding, foul vaginal discharge, or if you think you have expelled the IUD. Nothing inserted in the vagina for 48 hours. You will be scheduled for a follow up visit in approximately four weeks.    

## 2022-07-20 NOTE — Telephone Encounter (Signed)
I contacted the patient via phone. I left message for the patient to call back to be scheduled. [facility] has an openings today.

## 2022-07-20 NOTE — Progress Notes (Signed)
Pamela Banana, MD   Chief Complaint  Patient presents with   Contraception    IUD insertion    HPI:      [name] is a 22 y.o. G1P0010 whose LMP was Patient's last menstrual period was 07/17/2022 (exact date)., presents today for Paragard IUD insertion. Talked with Tresea Mall CNM at 2/24 appt. Wants non-hormonal BC. Due for pap/STD testing today.    Patient Active Problem List   Diagnosis Date Noted   Dermatosis of scalp 07/11/2022   Arthralgia of right hip 02/14/2021   Gluteal tendinitis, left hip 02/14/2021   It band syndrome, left 02/14/2021   Chronic idiopathic constipation 02/14/2021   Vitamin D deficiency 02/14/2021   Annual physical exam 10/25/2020   Cervicogenic headache 10/25/2020   Fatigue 10/25/2020   Acne 10/25/2020   Encounter for supervision of normal first pregnancy in first trimester 05/18/2020   Pelvic and perineal pain 03/22/2020   Sleep difficulties 07/17/2015   Inattention 06/17/2015   Aphthae 06/11/2015   Headache disorder 06/11/2015   Nocturnal enuresis 06/11/2015   Allergic rhinitis, seasonal 06/11/2015   Migraine without aura and without status migrainosus, not intractable 01/05/2015   Episodic tension-type headache, not intractable 01/05/2015    Past Surgical History:  Procedure Laterality Date   INDUCED ABORTION     TONSILLECTOMY AND ADENOIDECTOMY     WISDOM TOOTH EXTRACTION      Family History  Problem Relation Age of Onset   Anxiety disorder Mother    Diabetes Mother    Hypertension Mother    Other Father 71       Drowned   Diabetes Maternal Grandmother    Skin cancer Maternal Grandmother    Kidney disease Maternal Grandmother    Heart attack Maternal Grandfather 42   Breast cancer Paternal Grandmother        mid years   Healthy Half-Brother    Healthy Half-Sister     Social History   Socioeconomic History   Marital status: Single    Spouse name: Not on file   Number of children: 0   Years of  education: 12   Highest education level: Some college, no degree  Occupational History   Not on file  Tobacco Use   Smoking status: Never   Smokeless tobacco: Never  Vaping Use   Vaping Use: Never used  Substance and Sexual Activity   Alcohol use: Not Currently   Drug use: Not Currently   Sexual activity: Yes    Partners: Male    Birth control/protection: None  Other Topics Concern   Not on file  Social History Narrative   ** Merged History Encounter **       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 Pamela Foster lives with her mother, grandmother, cousin, and cousins child. Pamela Foster enjoys reading, listening to music and being active outside, especially climbing trees. Pamela Foster does well in school.   Social Determinants of Health   Financial Resource Strain: Low Risk  (07/04/2022)   Overall Financial Resource Strain (CARDIA)    Difficulty of Paying Living Expenses: Not hard at all  Food Insecurity: No Food Insecurity (07/04/2022)   Hunger Vital Sign    Worried About Running Out of Food in the Last Year: Never true    Ran Out of Food in the Last Year: Never true  Transportation Needs: No Transportation Needs (07/04/2022)   PRAPARE - Administrator, Civil Service (Medical): No  Lack of Transportation (Non-Medical): No  Physical Activity: Insufficiently Active (07/04/2022)   Exercise Vital Sign    Days of Exercise per Week: 3 days    Minutes of Exercise per Session: 30 min  Stress: Stress Concern Present (07/04/2022)   [employer] of Occupational Health - Occupational Stress Questionnaire    Feeling of Stress : To some extent  Social Connections: Socially Isolated (07/04/2022)   Social Connection and Isolation Panel [NHANES]    Frequency of Communication with Friends and Family: More than three times a week    Frequency of Social Gatherings with Friends and Family: Once a week    Attends Religious Services: Never    Database administrator or Organizations: No     Attends Engineer, structural: Not on file    Marital Status: Never married  Intimate Partner Violence: Not At Risk (04/14/2020)   Humiliation, Afraid, Rape, and Kick questionnaire    Fear of Current or Ex-Partner: No    Emotionally Abused: No    Physically Abused: No    Sexually Abused: No    Outpatient Medications Prior to Visit  Medication Sig Dispense Refill   ketoconazole (NIZORAL) 2 % shampoo Apply to scalp twice a week for 8 weeks and then weekly thereafter 120 mL 11   paragard intrauterine copper IUD IUD 1 each by Intrauterine route once.     No facility-administered medications prior to visit.      ROS:  Review of Systems  Constitutional:  Negative for fever.  Gastrointestinal:  Negative for blood in stool, constipation, diarrhea, nausea and vomiting.  Genitourinary:  Negative for dyspareunia, dysuria, flank pain, frequency, hematuria, urgency, vaginal bleeding, vaginal discharge and vaginal pain.  Musculoskeletal:  Negative for back pain.  Skin:  Negative for rash.   BREAST: No symptoms   OBJECTIVE:   Vitals:  BP 104/70   Ht 5\' 4"  (1.626 m)   Wt 121 lb (54.9 kg)   LMP 07/17/2022 (Exact Date)   BMI 20.77 kg/m   Physical Exam Vitals reviewed.  Constitutional:      Appearance: She is well-developed.  Pulmonary:     Effort: Pulmonary effort is normal.  Genitourinary:    General: Normal vulva.     Pubic Area: No rash.      Labia:        Right: No rash, tenderness or lesion.        Left: No rash, tenderness or lesion.      Vagina: Bleeding present. No vaginal discharge, erythema or tenderness.     Cervix: Normal.  Musculoskeletal:        General: Normal range of motion.     Cervical back: Normal range of motion.  Skin:    General: Skin is warm and dry.  Neurological:     General: No focal deficit present.     Mental Status: She is alert and oriented to person, place, and time.  Psychiatric:        Mood and Affect: Mood normal.         Behavior: Behavior normal.        Thought Content: Thought content normal.        Judgment: Judgment normal.    IUD Insertion Procedure Note Patient identified, informed consent performed, consent signed.   Discussed risks of irregular bleeding, cramping, infection, malpositioning or misplacement of the IUD outside the uterus which may require further procedure such as laparoscopy, risk of failure <1%. Time out was performed.  Speculum placed in the vagina.  Cervix visualized.  Cleaned with Betadine x 2.  Grasped anteriorly with a single tooth tenaculum.  Uterus sounded to 6.0 cm.   IUD placed per manufacturer's recommendations.  Strings trimmed to 3 cm. Tenaculum was removed, good hemostasis noted.  Patient tolerated procedure well.    ASSESSMENT:  Cervical cancer screening - Plan: Cytology - PAP  Screening for STD (sexually transmitted disease) - Plan: Cytology - PAP  Encounter for insertion of intrauterine contraceptive device (IUD) - Plan: paragard intrauterine copper IUD 1 each   Meds ordered this encounter  Medications   paragard intrauterine copper IUD 1 each     Plan:  Patient was given post-procedure instructions.  She was advised to have backup contraception for one week.   Call if you are having increasing pain, cramps or bleeding or if you have a fever greater than 100.4 degrees F., shaking chills, nausea or vomiting. Patient was also asked to check IUD strings periodically and follow up in 4 weeks for IUD check.  Return in about 4 weeks (around 08/17/2022) for IUD f/u.  Takeyla Million B. Delesa Kawa, PA-C 07/20/2022 11:45 AM

## 2022-07-21 ENCOUNTER — Encounter: Payer: Self-pay | Admitting: Obstetrics and Gynecology

## 2022-07-25 DIAGNOSIS — F411 Generalized anxiety disorder: Secondary | ICD-10-CM | POA: Diagnosis not present

## 2022-07-26 LAB — CYTOLOGY - PAP
Chlamydia: NEGATIVE
Comment: NEGATIVE
Comment: NORMAL
Diagnosis: NEGATIVE
Neisseria Gonorrhea: NEGATIVE

## 2022-08-01 DIAGNOSIS — F411 Generalized anxiety disorder: Secondary | ICD-10-CM | POA: Diagnosis not present

## 2022-08-09 DIAGNOSIS — F411 Generalized anxiety disorder: Secondary | ICD-10-CM | POA: Diagnosis not present

## 2022-08-12 IMAGING — CR DG HIP (WITH OR WITHOUT PELVIS) 2-3V*R*
3 series · 3 of 3 positions shown · non-contrast
Comparison: None.

CLINICAL DATA: Bilateral hip pain.

EXAM:
DG HIP (WITH OR WITHOUT PELVIS) 2-3V LEFT; DG HIP (WITH OR WITHOUT
PELVIS) 2-3V RIGHT

[pelvis ap]
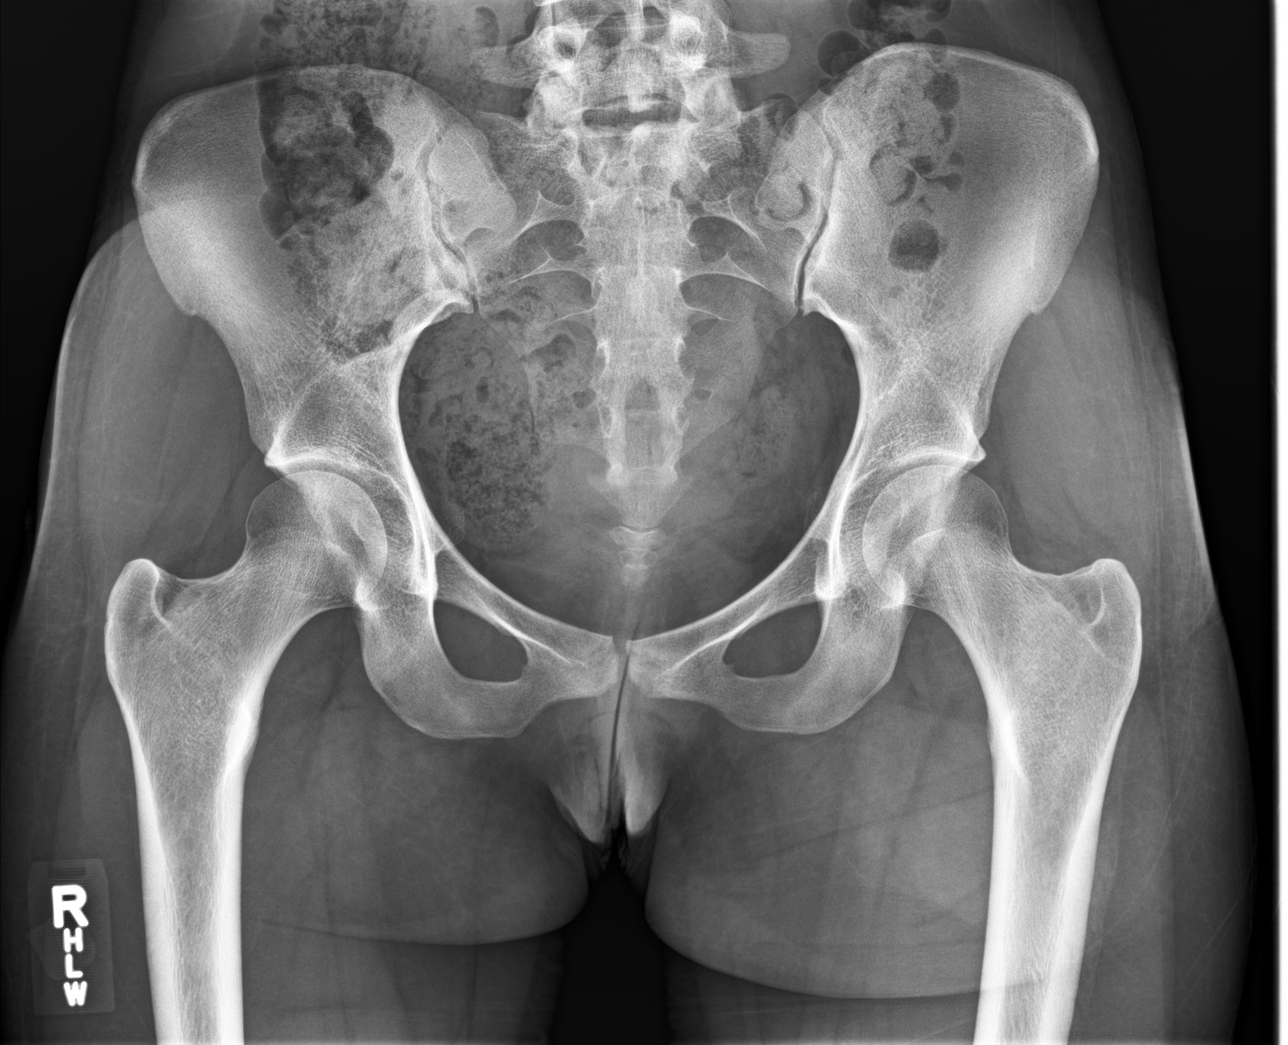

[hip ap]
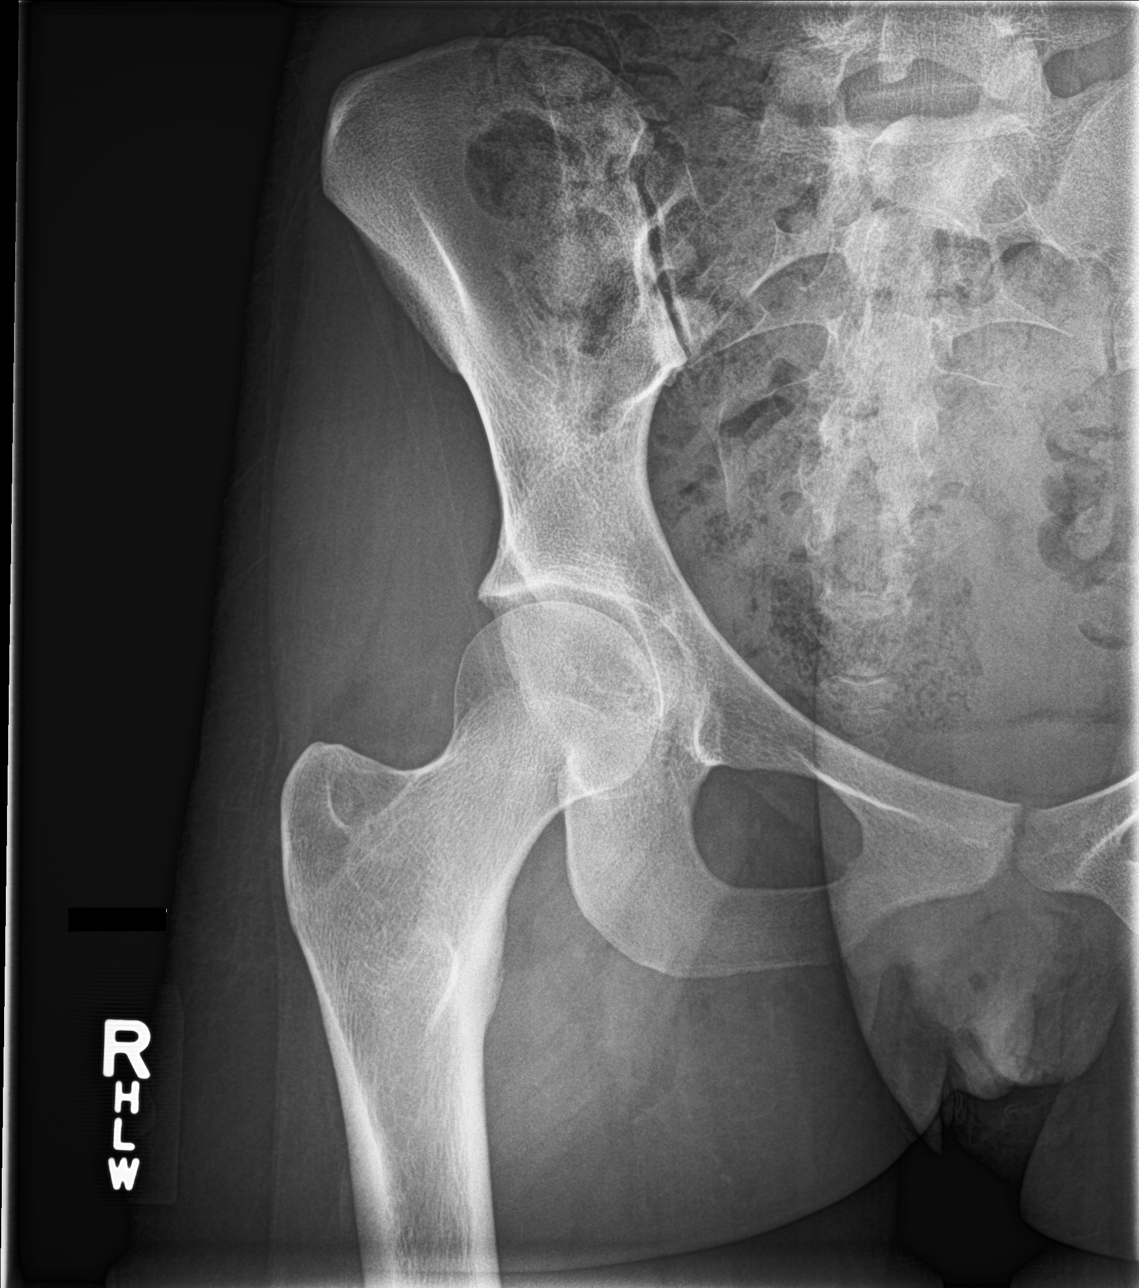

[hip lat]
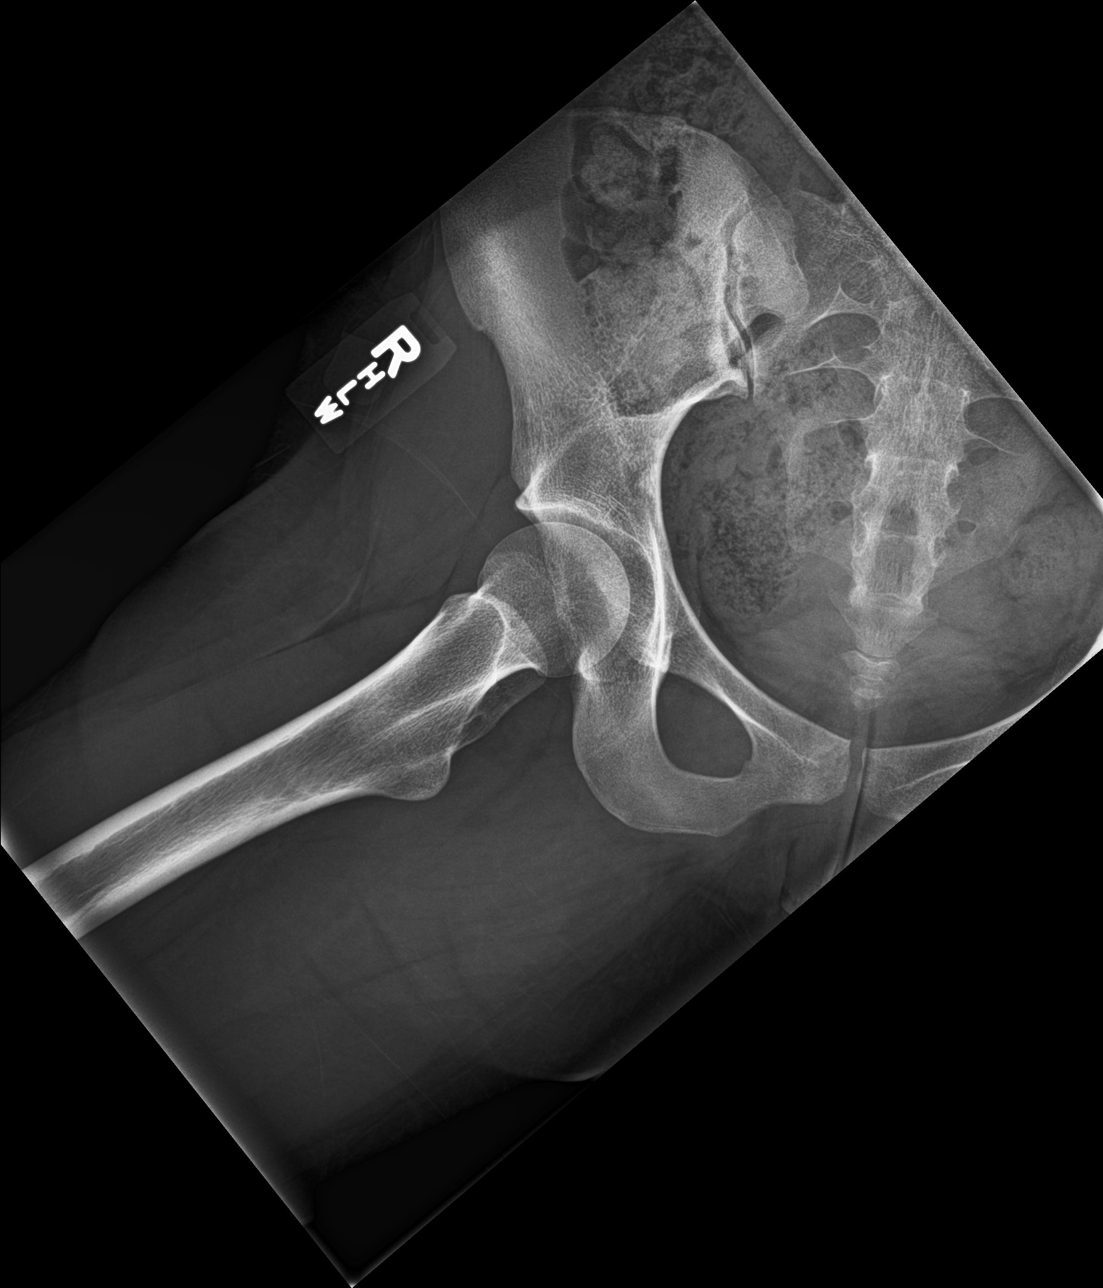

[3 of 3 positions shown; findings below may reference images not displayed]

FINDINGS: There is no evidence of hip fracture or dislocation. There is no
evidence of arthropathy or other focal bone abnormality.
IMPRESSION: Negative.

## 2022-08-12 IMAGING — CR DG LUMBAR SPINE COMPLETE 4+V
5 series · 5 of 5 positions shown · non-contrast
Comparison: None.

CLINICAL DATA: Back pain.

EXAM:
LUMBAR SPINE - COMPLETE 4+ VIEW

[l-spine ap]
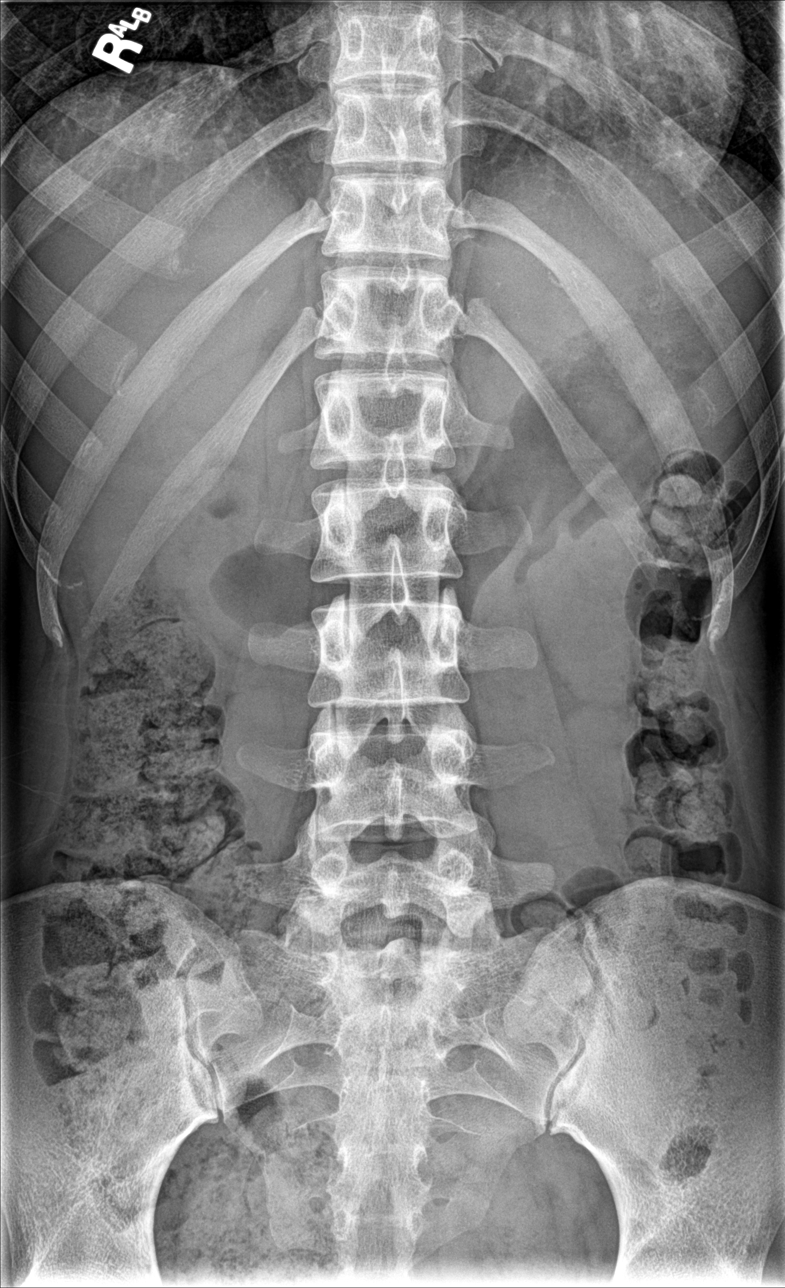

[l-spine obl (1 of 2)]
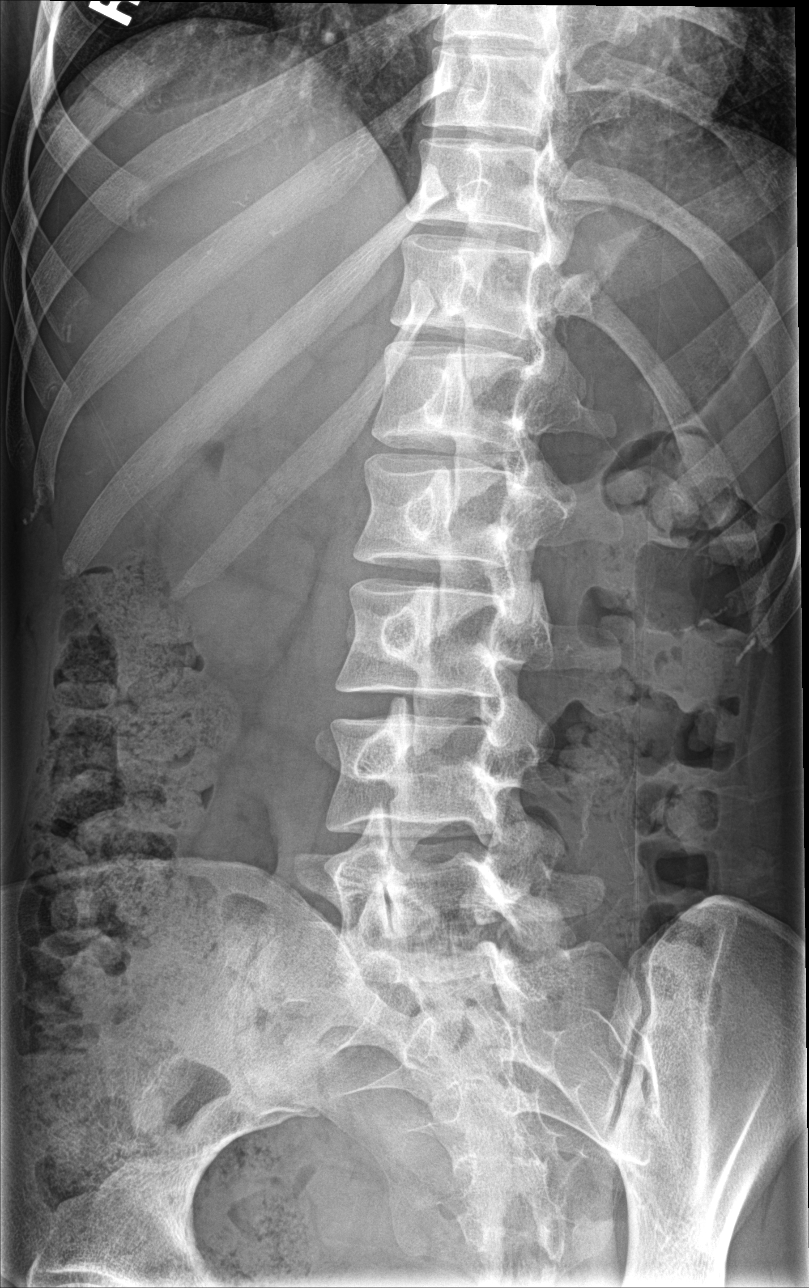

[l-spine obl (2 of 2)]
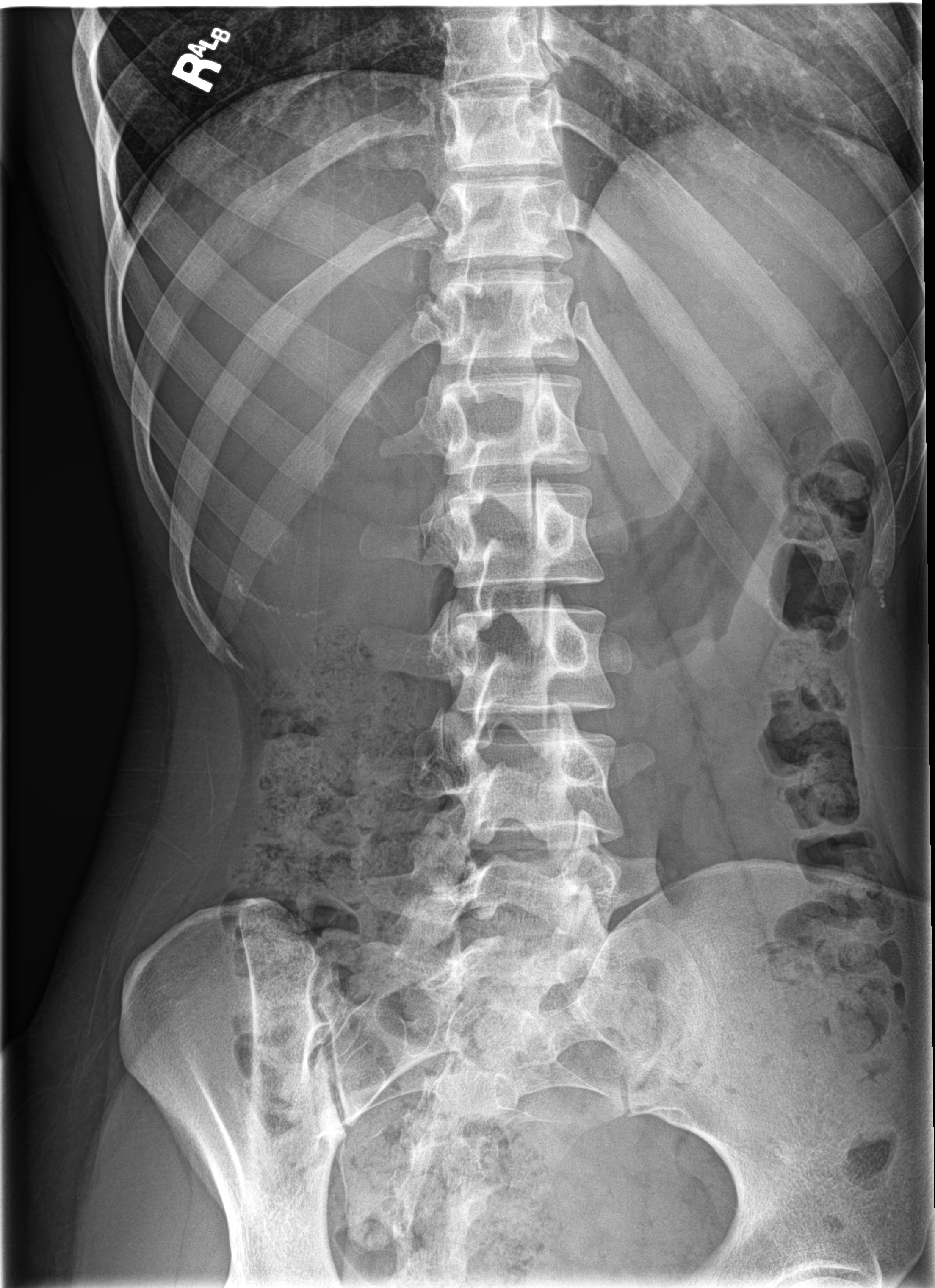

[l-spine lat]
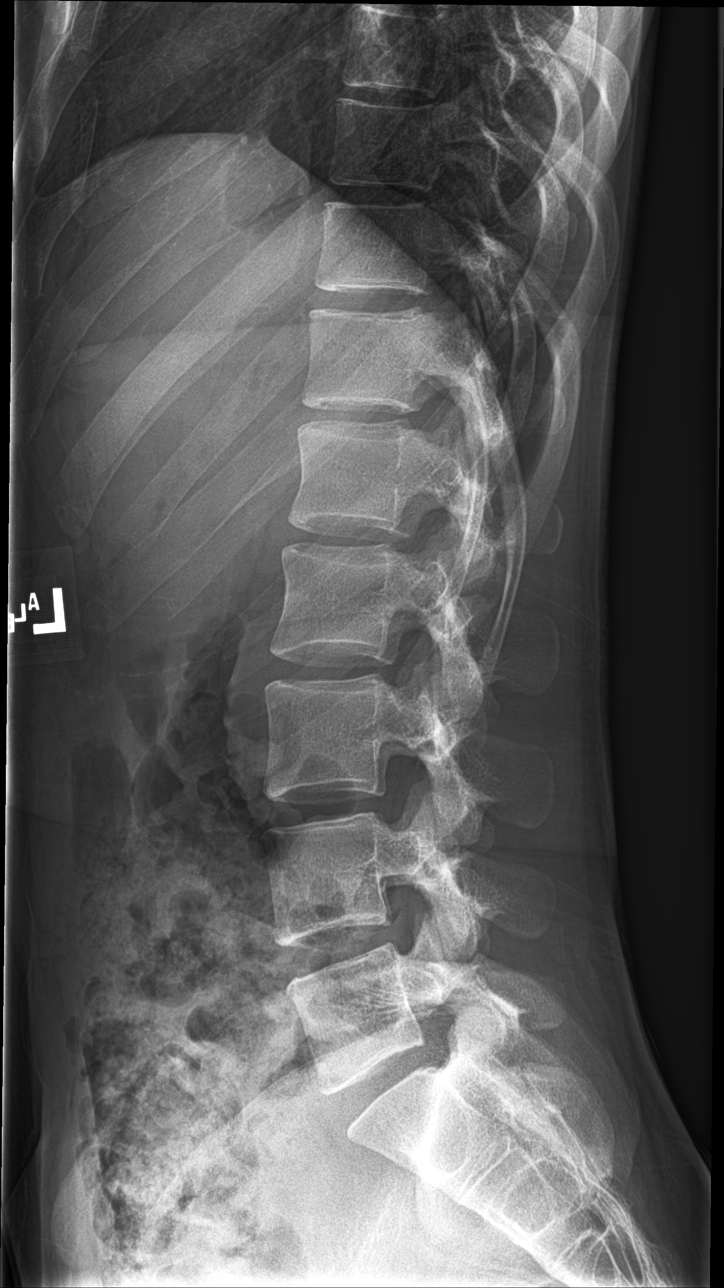

[l-spine spot]
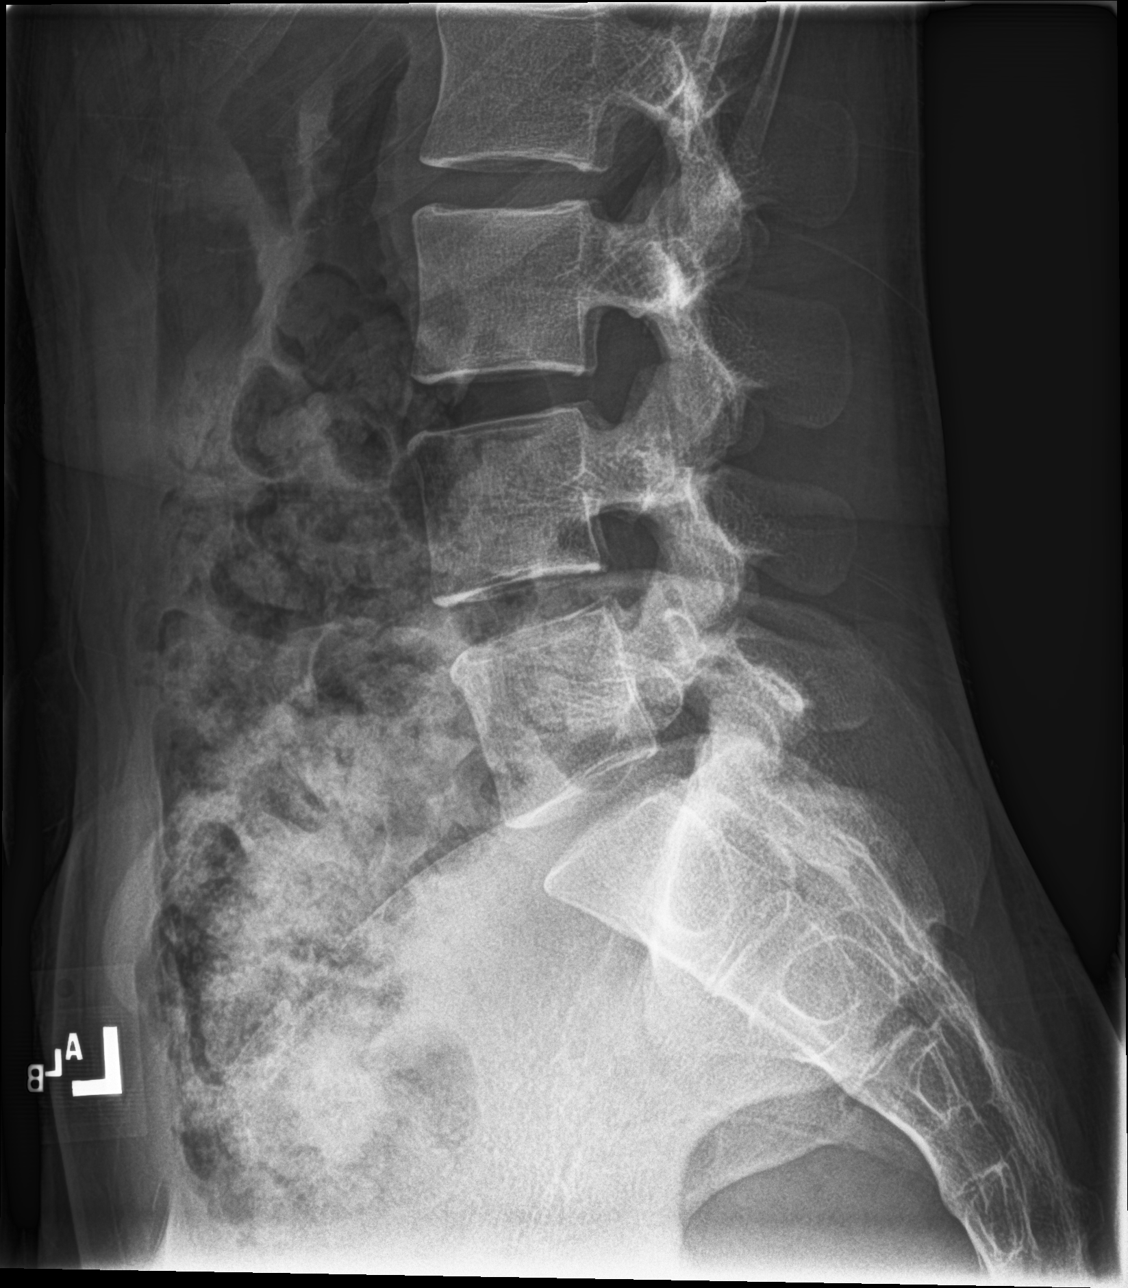

[5 of 5 positions shown; findings below may reference images not displayed]

FINDINGS: Parathyroid adenoma. There is no acute fracture or subluxation of
the lumbar spine. The vertebral body heights and disc spaces are
maintained. The visualized posterior elements are intact. The soft
tissues are unremarkable.
IMPRESSION: Negative.

## 2022-08-15 ENCOUNTER — Ambulatory Visit: Payer: Medicaid Other | Admitting: Obstetrics and Gynecology

## 2022-08-15 ENCOUNTER — Encounter: Payer: Self-pay | Admitting: Obstetrics and Gynecology

## 2022-08-15 VITALS — BP 98/64 | Ht 64.0 in | Wt 120.0 lb

## 2022-08-15 DIAGNOSIS — Z30431 Encounter for routine checking of intrauterine contraceptive device: Secondary | ICD-10-CM

## 2022-08-15 DIAGNOSIS — N921 Excessive and frequent menstruation with irregular cycle: Secondary | ICD-10-CM | POA: Diagnosis not present

## 2022-08-15 NOTE — Progress Notes (Signed)
   Chief Complaint  Patient presents with   IUD check    No concerns     History of Present Illness:  Pamela Foster is a 22 y.o. that had a Paragard IUD placed approximately 1 month ago. Since that time, she has had almost daily spotting, mild dysmen, improved with NSAIDs. Had some spotting with sex, no pain. No heavy bleeding. Thinks period is starting now.   Review of Systems  Constitutional:  Negative for fever.  Gastrointestinal:  Negative for blood in stool, constipation, diarrhea, nausea and vomiting.  Genitourinary:  Positive for vaginal bleeding. Negative for dyspareunia, dysuria, flank pain, frequency, hematuria, urgency, vaginal discharge and vaginal pain.  Musculoskeletal:  Negative for back pain.  Skin:  Negative for rash.    Physical Exam:  BP 98/64   Ht 5\' 4"  (1.626 m)   Wt 120 lb (54.4 kg)   LMP 08/13/2022 (Exact Date)   BMI 20.60 kg/m  Body mass index is 20.6 kg/m.  Pelvic exam:  Two IUD strings present seen coming from the cervical os. EGBUS, vaginal vault and cervix: within normal limits   Assessment:   Encounter for routine checking of intrauterine contraceptive device (IUD)--IUD strings in cx os. Has 10 yr indication  Breakthrough bleeding with IUD--most likely normal. Pt to give it another month or so to improve. If cont will check GYN u/s for placement. F/u prn.   IUD strings present in proper location; pt doing well  Plan: F/u if any signs of infection or can no longer feel the strings.   Valda Christenson B. Maxon Kresse, PA-C 08/15/2022 11:34 AM

## 2022-08-15 NOTE — Patient Instructions (Signed)
I value your feedback and you entrusting us with your care. If you get a [facility] patient survey, I would appreciate you taking the time to let us know about your experience today. Thank you! ? ? ?

## 2022-08-29 DIAGNOSIS — F411 Generalized anxiety disorder: Secondary | ICD-10-CM | POA: Diagnosis not present

## 2022-08-30 ENCOUNTER — Encounter: Payer: Self-pay | Admitting: Family Medicine

## 2022-08-30 ENCOUNTER — Ambulatory Visit (INDEPENDENT_AMBULATORY_CARE_PROVIDER_SITE_OTHER): Payer: Medicaid Other | Admitting: Family Medicine

## 2022-08-30 VITALS — BP 118/78 | HR 74 | Ht 64.0 in | Wt 119.0 lb

## 2022-08-30 DIAGNOSIS — Z23 Encounter for immunization: Secondary | ICD-10-CM | POA: Diagnosis not present

## 2022-08-30 DIAGNOSIS — L989 Disorder of the skin and subcutaneous tissue, unspecified: Secondary | ICD-10-CM | POA: Diagnosis not present

## 2022-08-30 DIAGNOSIS — Z0000 Encounter for general adult medical examination without abnormal findings: Secondary | ICD-10-CM

## 2022-08-30 DIAGNOSIS — E559 Vitamin D deficiency, unspecified: Secondary | ICD-10-CM | POA: Diagnosis not present

## 2022-08-30 DIAGNOSIS — Z114 Encounter for screening for human immunodeficiency virus [HIV]: Secondary | ICD-10-CM

## 2022-08-30 DIAGNOSIS — Z13220 Encounter for screening for lipoid disorders: Secondary | ICD-10-CM

## 2022-08-30 DIAGNOSIS — Z1159 Encounter for screening for other viral diseases: Secondary | ICD-10-CM

## 2022-08-30 MED ORDER — KETOCONAZOLE 2 % EX SHAM: MEDICATED_SHAMPOO | CUTANEOUS | 11 refills | Status: DC

## 2022-08-30 NOTE — Patient Instructions (Signed)
-   Obtain fasting labs with orders provided (can have water or black coffee but otherwise no food or drink x 8 hours before labs) °- Review information provided °- Attend eye doctor annually, dentist every 6 months, work towards or maintain 30 minutes of moderate intensity physical activity at least 5 days per week, and consume a balanced diet °- Return in 1 year for physical °- Contact us for any questions between now and then °

## 2022-08-30 NOTE — Progress Notes (Signed)
Annual Physical Exam Visit  Patient Information:  Patient ID: Pamela Foster, female DOB: 08/05/2000 Age: 22 y.o. MRN: [id number]   Subjective:   CC: Annual Physical Exam  HPI:  Pamela Foster is here for their annual physical.  I reviewed the past medical history, family history, social history, surgical history, and allergies today and changes were made as necessary.  Please see the problem list section below for additional details.  Past Medical History: Past Medical History:  Diagnosis Date   Adenopathy 06/11/2015   Allergy    Anxiety    Depression    Environmental and seasonal allergies    Recurrent sinus infections 06/11/2015   Past Surgical History: Past Surgical History:  Procedure Laterality Date   INDUCED ABORTION     TONSILLECTOMY AND ADENOIDECTOMY     WISDOM TOOTH EXTRACTION     Family History: Family History  Problem Relation Age of Onset   Anxiety disorder Mother    Diabetes Mother    Hypertension Mother    Other Father 18       Drowned   Diabetes Maternal Grandmother    Skin cancer Maternal Grandmother    Kidney disease Maternal Grandmother    Heart attack Maternal Grandfather 81   Breast cancer Paternal Grandmother        mid years   Healthy Half-Brother    Healthy Half-Sister    Allergies: No Known Allergies Health Maintenance: Health Maintenance  Topic Date Due   COVID-19 Vaccine (1) Never done   HPV VACCINES (1 - 2-dose series) Never done   HIV Screening  Never done   Hepatitis C Screening  Never done   INFLUENZA VACCINE  11/09/2022   CHLAMYDIA SCREENING  07/20/2023   PAP-Cervical Cytology Screening  07/19/2025   PAP SMEAR-Modifier  07/19/2025   DTaP/Tdap/Td (3 - Td or Tdap) 08/29/2032    HM Colonoscopy     This patient has no relevant Health Maintenance data.      Medications: Current Outpatient Medications on File Prior to Visit  Medication Sig Dispense Refill   paragard intrauterine copper IUD IUD 1 each by Intrauterine route  once.     No current facility-administered medications on file prior to visit.    Review of Systems: No headache, visual changes, nausea, vomiting, diarrhea, constipation, dizziness, abdominal pain, skin rash, fevers, chills, night sweats, swollen lymph nodes, weight loss, chest pain, body aches, joint swelling, muscle aches, shortness of breath, mood changes, visual or auditory hallucinations reported.  Objective:   Vitals:   08/30/22 0841  BP: 118/78  Pulse: 74  SpO2: 98%   Vitals:   08/30/22 0841  Weight: 119 lb (54 kg)  Height: 5\' 4"  (1.626 m)   Body mass index is 20.43 kg/m.  General: Well Developed, well nourished, and in no acute distress.  Neuro: Alert and oriented x3, extra-ocular muscles intact, sensation grossly intact. Cranial nerves II through XII are grossly intact, motor, sensory, and coordinative functions are intact. HEENT: Normocephalic, atraumatic, pupils equal round reactive to light, neck supple, no masses, no lymphadenopathy, thyroid nonpalpable. Oropharynx, nasopharynx, external ear canals are unremarkable. Skin: Warm and dry, no rashes noted.  Cardiac: Regular rate and rhythm, no murmurs rubs or gallops. No peripheral edema. Pulses symmetric. Respiratory: Clear to auscultation bilaterally. Not using accessory muscles, speaking in full sentences.  Abdominal: Soft, nontender, nondistended, positive bowel sounds, no masses, no organomegaly. Musculoskeletal: Shoulder, elbow, wrist, hip, knee, ankle stable, and with full range of motion.  Female chaperone initials: KG present throughout the physical examination.  Impression and Recommendations:   The patient was counselled, risk factors were discussed, and anticipatory guidance given.  Problem List Items Addressed This Visit       Musculoskeletal and Integument   Dermatosis of scalp    Doing well with ketoconazole shampoo        Other   Annual physical exam   Relevant Orders   CBC   Comprehensive  metabolic panel   Hepatitis C antibody   HIV Antibody (routine testing w rflx)   Lipid panel   TSH   VITAMIN D 25 Hydroxy (Vit-D Deficiency, Fractures)   Healthcare maintenance - Primary   Relevant Orders   CBC   Comprehensive metabolic panel   Hepatitis C antibody   HIV Antibody (routine testing w rflx)   Lipid panel   TSH   VITAMIN D 25 Hydroxy (Vit-D Deficiency, Fractures)   Vitamin D deficiency   Relevant Orders   VITAMIN D 25 Hydroxy (Vit-D Deficiency, Fractures)   Other Visit Diagnoses     Screening for lipoid disorders       Relevant Orders   Comprehensive metabolic panel   Lipid panel   Need for hepatitis C screening test       Relevant Orders   Hepatitis C antibody   Screening for HIV (human immunodeficiency virus)       Relevant Orders   HIV Antibody (routine testing w rflx)   Need for Tdap vaccination       Relevant Orders   Tdap vaccine greater than or equal to 7yo IM (Completed)        Orders & Medications Medications:  Meds ordered this encounter  Medications   ketoconazole (NIZORAL) 2 % shampoo    Sig: Apply to scalp twice a week for 8 weeks and then weekly thereafter    Dispense:  120 mL    Refill:  11   Orders Placed This Encounter  Procedures   Tdap vaccine greater than or equal to 7yo IM   CBC   Comprehensive metabolic panel   Hepatitis C antibody   HIV Antibody (routine testing w rflx)   Lipid panel   TSH   VITAMIN D 25 Hydroxy (Vit-D Deficiency, Fractures)     Return in about 1 year (around 08/30/2023) for CPE.    [name], MD, [facility]   Primary Care Sports Medicine Primary Care and Sports Medicine at [facility]

## 2022-08-30 NOTE — Assessment & Plan Note (Signed)
Doing well with ketoconazole shampoo

## 2022-09-12 DIAGNOSIS — F411 Generalized anxiety disorder: Secondary | ICD-10-CM | POA: Diagnosis not present

## 2022-09-26 DIAGNOSIS — F411 Generalized anxiety disorder: Secondary | ICD-10-CM | POA: Diagnosis not present

## 2022-10-10 DIAGNOSIS — F411 Generalized anxiety disorder: Secondary | ICD-10-CM | POA: Diagnosis not present

## 2022-10-24 DIAGNOSIS — F411 Generalized anxiety disorder: Secondary | ICD-10-CM | POA: Diagnosis not present

## 2022-11-14 DIAGNOSIS — F411 Generalized anxiety disorder: Secondary | ICD-10-CM | POA: Diagnosis not present

## 2022-11-28 DIAGNOSIS — F411 Generalized anxiety disorder: Secondary | ICD-10-CM | POA: Diagnosis not present

## 2022-12-20 DIAGNOSIS — F411 Generalized anxiety disorder: Secondary | ICD-10-CM | POA: Diagnosis not present

## 2022-12-26 DIAGNOSIS — F411 Generalized anxiety disorder: Secondary | ICD-10-CM | POA: Diagnosis not present

## 2023-01-02 DIAGNOSIS — F411 Generalized anxiety disorder: Secondary | ICD-10-CM | POA: Diagnosis not present

## 2023-01-10 DIAGNOSIS — F411 Generalized anxiety disorder: Secondary | ICD-10-CM | POA: Diagnosis not present

## 2023-01-16 DIAGNOSIS — F411 Generalized anxiety disorder: Secondary | ICD-10-CM | POA: Diagnosis not present

## 2023-01-23 DIAGNOSIS — F411 Generalized anxiety disorder: Secondary | ICD-10-CM | POA: Diagnosis not present

## 2023-01-27 ENCOUNTER — Ambulatory Visit
Admission: EM | Admit: 2023-01-27 | Discharge: 2023-01-27 | Disposition: A | Discharge status: Home / Self Care | Payer: Medicaid Other | Attending: Family Medicine | Admitting: Family Medicine

## 2023-01-27 ENCOUNTER — Ambulatory Visit (INDEPENDENT_AMBULATORY_CARE_PROVIDER_SITE_OTHER): Payer: Medicaid Other

## 2023-01-27 DIAGNOSIS — R059 Cough, unspecified: Secondary | ICD-10-CM

## 2023-01-27 DIAGNOSIS — J069 Acute upper respiratory infection, unspecified: Secondary | ICD-10-CM | POA: Insufficient documentation

## 2023-01-27 LAB — GROUP A STREP BY PCR: Group A Strep by PCR: NOT DETECTED

## 2023-01-27 MED ORDER — PROMETHAZINE-DM 6.25-15 MG/5ML PO SYRP: 5.0000 mL | ORAL_SOLUTION | Freq: Four times a day (QID) | ORAL | 0 refills | Status: AC | PRN

## 2023-01-27 MED ORDER — BENZONATATE 100 MG PO CAPS: 100.0000 mg | ORAL_CAPSULE | Freq: Three times a day (TID) | ORAL | 0 refills | Status: AC

## 2023-01-27 NOTE — Discharge Instructions (Addendum)
Your strep test is negative.  On my review of your xray images, you did not have a pneumonia. The radiologist has not yet read your xray. If it is significantly abnormal or urgent, someone will contact you.  You should see your results in MyChart.  Stop by the pharmacy to pick up your prescriptions.  Follow up with your primary care provider as needed.  You can take Tylenol and/or Ibuprofen as needed for fever reduction and pain relief.    For cough: honey 1/2 to 1 teaspoon (you can dilute the honey in water or another fluid).  You can also use guaifenesin and dextromethorphan for cough. You can use a humidifier for chest congestion and cough.  If you don't have a humidifier, you can sit in the bathroom with the hot shower running.      For sore throat: try warm salt water gargles, Mucinex sore throat cough drops or cepacol lozenges, throat spray, warm tea or water with lemon/honey, popsicles or ice, or OTC cold relief medicine for throat discomfort. You can also purchase chloraseptic spray at the pharmacy or dollar store.   For congestion: take a daily anti-histamine like Zyrtec, Claritin, and a oral decongestant, such as pseudoephedrine.  You can also use Flonase 1-2 sprays in each nostril daily. Afrin is also a good option, if you do not have high blood pressure.    It is important to stay hydrated: drink plenty of fluids (water, gatorade/powerade/pedialyte, juices, or teas) to keep your throat moisturized and help further relieve irritation/discomfort.    Return or go to the Emergency Department if symptoms worsen or do not improve in the next few days

## 2023-01-27 NOTE — ED Provider Notes (Signed)
[facility]    CSN: [id number] Arrival date & time: 01/27/23  1058      History   Chief Complaint Chief Complaint  Patient presents with   Sore Throat   Generalized Body Aches    HPI Pamela Foster is a 22 y.o. female.   HPI  History obtained from the patient. Pamela Foster presents for sore throat that started on Sunday. Has nasal and chest congestion, chest tightness, productive cough, right ear pain. Denies vomiting and diarrhea.  Thinks she had a fever last night. She was sweating bad last night and figured she had a fever.  No known sick contacts.   No history of asthma. Denies smoking or vaping.  She is concerned that she has pneumonia and requests a chest x-ray.    Past Medical History:  Diagnosis Date   Adenopathy 06/11/2015   Allergy    Anxiety    Depression    Environmental and seasonal allergies    Recurrent sinus infections 06/11/2015    Patient Active Problem List   Diagnosis Date Noted   Healthcare maintenance 08/30/2022   Dermatosis of scalp 07/11/2022   Arthralgia of right hip 02/14/2021   Gluteal tendinitis, left hip 02/14/2021   It band syndrome, left 02/14/2021   Chronic idiopathic constipation 02/14/2021   Vitamin D deficiency 02/14/2021   Annual physical exam 10/25/2020   Cervicogenic headache 10/25/2020   Fatigue 10/25/2020   Acne 10/25/2020   Encounter for supervision of normal first pregnancy in first trimester 05/18/2020   Pelvic and perineal pain 03/22/2020   Sleep difficulties 07/17/2015   Inattention 06/17/2015   Aphthae 06/11/2015   Headache disorder 06/11/2015   Nocturnal enuresis 06/11/2015   Allergic rhinitis, seasonal 06/11/2015   Migraine without aura and without status migrainosus, not intractable 01/05/2015   Episodic tension-type headache, not intractable 01/05/2015    Past Surgical History:  Procedure Laterality Date   INDUCED ABORTION     TONSILLECTOMY AND ADENOIDECTOMY     WISDOM TOOTH EXTRACTION      OB  History     Gravida  1   Para  0   Term  0   Preterm  0   AB  1   Living         SAB  0   IAB  1   Ectopic  0   Multiple      Live Births               Home Medications    Prior to Admission medications   Medication Sig Start Date End Date Taking? Authorizing Provider  benzonatate (TESSALON) 100 MG capsule Take 1 capsule (100 mg total) by mouth every 8 (eight) hours. 01/27/23  Yes Justyna Timoney, Seward Meth, DO  paragard intrauterine copper IUD IUD 1 each by Intrauterine route once. 07/20/22  Yes Copland, Ilona Sorrel, PA-C  promethazine-dextromethorphan (PROMETHAZINE-DM) 6.25-15 MG/5ML syrup Take 5 mLs by mouth 4 (four) times daily as needed. 01/27/23  Yes Katha Cabal, DO    Family History Family History  Problem Relation Age of Onset   Anxiety disorder Mother    Diabetes Mother    Hypertension Mother    Other Father 64       Drowned   Diabetes Maternal Grandmother    Skin cancer Maternal Grandmother    Kidney disease Maternal Grandmother    Heart attack Maternal Grandfather 24   Breast cancer Paternal Grandmother        mid years   Healthy Half-Brother  Healthy Half-Sister     Social History Social History   Tobacco Use   Smoking status: Never   Smokeless tobacco: Never  Vaping Use   Vaping status: Never Used  Substance Use Topics   Alcohol use: Not Currently   Drug use: Not Currently     Allergies   Patient has no known allergies.   Review of Systems Review of Systems: negative unless otherwise stated in HPI.      Physical Exam Triage Vital Signs ED Triage Vitals [01/27/23 1137]  Encounter Vitals Group     BP 117/71     Systolic BP Percentile      Diastolic BP Percentile      Pulse Rate 86     Resp 17     Temp 99.1 F (37.3 C)     Temp Source Oral     SpO2 97 %     Weight      Height      Head Circumference      Peak Flow      Pain Score 5     Pain Loc      Pain Education      Exclude from Growth Chart    No data  found.  Updated Vital Signs BP 117/71 (BP Location: Right Arm)   Pulse 86   Temp 99.1 F (37.3 C) (Oral)   Resp 17   LMP 01/09/2023   SpO2 97%   Visual Acuity Right Eye Distance:   Left Eye Distance:   Bilateral Distance:    Right Eye Near:   Left Eye Near:    Bilateral Near:     Physical Exam GEN:     alert, non-toxic appearing female in no distress    HENT:  mucus membranes moist, oropharyngeal without lesions, mild erythema, no tonsillar hypertrophy or exudates, no nasal discharge EYES:   pupils equal and reactive, no scleral injection or discharge NECK:  normal ROM, no meningismus   RESP:  no increased work of breathing, clear to auscultation bilaterally CVS:   regular rate and rhythm Skin:   warm and dry, no rash on visible skin    UC Treatments / Results  Labs (all labs ordered are listed, but only abnormal results are displayed) Labs Reviewed  GROUP A STREP BY PCR    EKG   Radiology DG Chest 2 View  Result Date: 01/27/2023 CLINICAL DATA:  Cough for 1 week. EXAM: CHEST - 2 VIEW COMPARISON:  05/25/2010 FINDINGS: The cardiac silhouette, mediastinal and hilar contours are normal. No acute pulmonary process. No pleural effusion or pneumothorax. The bony thorax is intact. IMPRESSION: No acute cardiopulmonary findings. Electronically Signed   By: Rudie Meyer M.D.   On: 01/27/2023 14:02    Procedures Procedures (including critical care time)  Medications Ordered in UC Medications - No data to display  Initial Impression / Assessment and Plan / UC Course  I have reviewed the triage vital signs and the nursing notes.  Pertinent labs & imaging results that were available during my care of the patient were reviewed by me and considered in my medical decision making (see chart for details).       Pt is a 22 y.o. female who presents for 6 days of respiratory symptoms. Pamela Foster is afebrile here without recent antipyretics. Satting well on room air. Overall pt is  non-toxic appearing, well hydrated, without respiratory distress. Pulmonary exam is unremarkable.  COVID testing deferred due to duration of symptoms.  Strep PCR  obtained and was negative.  Declined mono testing.  Patient requesting chest x-ray therefore 1 was ordered.  Chest xray personally reviewed by me without focal pneumonia, pleural effusion, cardiomegaly or pneumothorax. Patient aware the radiologist has not read her xray and is comfortable with the preliminary read by me. Will review radiologist read when available and call patient if a change in plan is warranted.  Pt agreeable to this plan prior to discharge.   History consistent with viral respiratory illness. Discussed symptomatic treatment.  Explained lack of efficacy of antibiotics in viral disease.  Typical duration of symptoms discussed.  Promethazine DM and Tessalon Perles for cough.  Declined a nasal spray for congestion.  Return and ED precautions given and voiced understanding. Discussed MDM, treatment plan and plan for follow-up with patient who agrees with plan.     Final Clinical Impressions(s) / UC Diagnoses   Final diagnoses:  URI with cough and congestion     Discharge Instructions      Your strep test is negative.  On my review of your xray images, you did not have a pneumonia. The radiologist has not yet read your xray. If it is significantly abnormal or urgent, someone will contact you.  You should see your results in MyChart.  Stop by the pharmacy to pick up your prescriptions.  Follow up with your primary care provider as needed.  You can take Tylenol and/or Ibuprofen as needed for fever reduction and pain relief.    For cough: honey 1/2 to 1 teaspoon (you can dilute the honey in water or another fluid).  You can also use guaifenesin and dextromethorphan for cough. You can use a humidifier for chest congestion and cough.  If you don't have a humidifier, you can sit in the bathroom with the hot shower running.       For sore throat: try warm salt water gargles, Mucinex sore throat cough drops or cepacol lozenges, throat spray, warm tea or water with lemon/honey, popsicles or ice, or OTC cold relief medicine for throat discomfort. You can also purchase chloraseptic spray at the pharmacy or dollar store.   For congestion: take a daily anti-histamine like Zyrtec, Claritin, and a oral decongestant, such as pseudoephedrine.  You can also use Flonase 1-2 sprays in each nostril daily. Afrin is also a good option, if you do not have high blood pressure.    It is important to stay hydrated: drink plenty of fluids (water, gatorade/powerade/pedialyte, juices, or teas) to keep your throat moisturized and help further relieve irritation/discomfort.    Return or go to the Emergency Department if symptoms worsen or do not improve in the next few days      ED Prescriptions     Medication Sig Dispense Auth. Provider   promethazine-dextromethorphan (PROMETHAZINE-DM) 6.25-15 MG/5ML syrup Take 5 mLs by mouth 4 (four) times daily as needed. 118 mL Pamela Scheibe, DO   benzonatate (TESSALON) 100 MG capsule Take 1 capsule (100 mg total) by mouth every 8 (eight) hours. 21 capsule Katha Cabal, DO      PDMP not reviewed this encounter.   Katha Cabal, DO 01/27/23 1438

## 2023-01-27 NOTE — ED Triage Notes (Signed)
Sx x 6 days.   Started with sore throat, body aches, hurts to breathe

## 2023-01-30 DIAGNOSIS — F411 Generalized anxiety disorder: Secondary | ICD-10-CM | POA: Diagnosis not present

## 2023-02-13 DIAGNOSIS — F411 Generalized anxiety disorder: Secondary | ICD-10-CM | POA: Diagnosis not present

## 2023-02-13 DIAGNOSIS — F4320 Adjustment disorder, unspecified: Secondary | ICD-10-CM | POA: Diagnosis not present

## 2023-02-20 DIAGNOSIS — F411 Generalized anxiety disorder: Secondary | ICD-10-CM | POA: Diagnosis not present

## 2023-02-27 DIAGNOSIS — F411 Generalized anxiety disorder: Secondary | ICD-10-CM | POA: Diagnosis not present

## 2023-03-13 DIAGNOSIS — F411 Generalized anxiety disorder: Secondary | ICD-10-CM | POA: Diagnosis not present

## 2023-03-27 DIAGNOSIS — F411 Generalized anxiety disorder: Secondary | ICD-10-CM | POA: Diagnosis not present

## 2023-04-17 DIAGNOSIS — F411 Generalized anxiety disorder: Secondary | ICD-10-CM | POA: Diagnosis not present

## 2023-05-03 DIAGNOSIS — F4320 Adjustment disorder, unspecified: Secondary | ICD-10-CM | POA: Diagnosis not present

## 2023-05-03 DIAGNOSIS — F411 Generalized anxiety disorder: Secondary | ICD-10-CM | POA: Diagnosis not present

## 2023-05-10 ENCOUNTER — Encounter: Payer: Self-pay | Admitting: Family Medicine

## 2023-05-11 ENCOUNTER — Other Ambulatory Visit: Payer: Self-pay

## 2023-05-11 DIAGNOSIS — L709 Acne, unspecified: Secondary | ICD-10-CM

## 2023-05-18 DIAGNOSIS — F411 Generalized anxiety disorder: Secondary | ICD-10-CM | POA: Diagnosis not present

## 2023-06-07 DIAGNOSIS — L819 Disorder of pigmentation, unspecified: Secondary | ICD-10-CM | POA: Diagnosis not present

## 2023-06-07 DIAGNOSIS — L738 Other specified follicular disorders: Secondary | ICD-10-CM | POA: Diagnosis not present

## 2023-06-07 DIAGNOSIS — L700 Acne vulgaris: Secondary | ICD-10-CM | POA: Diagnosis not present

## 2023-06-07 DIAGNOSIS — L905 Scar conditions and fibrosis of skin: Secondary | ICD-10-CM | POA: Diagnosis not present

## 2023-06-07 DIAGNOSIS — L218 Other seborrheic dermatitis: Secondary | ICD-10-CM | POA: Diagnosis not present

## 2023-06-08 DIAGNOSIS — F411 Generalized anxiety disorder: Secondary | ICD-10-CM | POA: Diagnosis not present

## 2023-06-25 DIAGNOSIS — L218 Other seborrheic dermatitis: Secondary | ICD-10-CM | POA: Diagnosis not present

## 2023-06-25 DIAGNOSIS — L700 Acne vulgaris: Secondary | ICD-10-CM | POA: Diagnosis not present

## 2023-07-04 ENCOUNTER — Ambulatory Visit: Payer: Medicaid Other | Admitting: Dermatology

## 2023-09-04 ENCOUNTER — Encounter: Payer: Self-pay | Admitting: Family Medicine

## 2023-09-07 DIAGNOSIS — F411 Generalized anxiety disorder: Secondary | ICD-10-CM | POA: Diagnosis not present

## 2023-09-21 DIAGNOSIS — F4320 Adjustment disorder, unspecified: Secondary | ICD-10-CM | POA: Diagnosis not present

## 2023-09-21 DIAGNOSIS — F411 Generalized anxiety disorder: Secondary | ICD-10-CM | POA: Diagnosis not present

## 2023-10-19 DIAGNOSIS — F411 Generalized anxiety disorder: Secondary | ICD-10-CM | POA: Diagnosis not present

## 2023-11-02 DIAGNOSIS — F4320 Adjustment disorder, unspecified: Secondary | ICD-10-CM | POA: Diagnosis not present

## 2023-11-02 DIAGNOSIS — F411 Generalized anxiety disorder: Secondary | ICD-10-CM | POA: Diagnosis not present

## 2023-11-23 DIAGNOSIS — F411 Generalized anxiety disorder: Secondary | ICD-10-CM | POA: Diagnosis not present

## 2023-11-23 DIAGNOSIS — F4320 Adjustment disorder, unspecified: Secondary | ICD-10-CM | POA: Diagnosis not present

## 2023-12-07 DIAGNOSIS — F411 Generalized anxiety disorder: Secondary | ICD-10-CM | POA: Diagnosis not present
